# Patient Record
Sex: Female | Born: 1995 | Race: Black or African American | Hispanic: No | Marital: Single | Smoking: Never smoker
Health system: Southern US, Community
[De-identification: ages and names within clinical notes are randomized; demographics above are authoritative.]

## PROBLEM LIST (undated history)

## (undated) ENCOUNTER — Inpatient Hospital Stay (HOSPITAL_COMMUNITY): Payer: Self-pay

## (undated) DIAGNOSIS — Z789 Other specified health status: Secondary | ICD-10-CM

## (undated) HISTORY — PX: NO PAST SURGERIES: SHX2092

## (undated) HISTORY — PX: WISDOM TOOTH EXTRACTION: SHX21

---
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
## 2017-02-02 ENCOUNTER — Inpatient Hospital Stay (HOSPITAL_COMMUNITY)
Admission: AD | Admit: 2017-02-02 | Discharge: 2017-02-02 | Disposition: A | Discharge status: Home / Self Care | Payer: Medicaid Other | Source: Ambulatory Visit | Attending: Obstetrics & Gynecology | Admitting: Obstetrics & Gynecology

## 2017-02-02 ENCOUNTER — Encounter (HOSPITAL_COMMUNITY): Payer: Self-pay | Admitting: *Deleted

## 2017-02-02 DIAGNOSIS — R109 Unspecified abdominal pain: Secondary | ICD-10-CM

## 2017-02-02 DIAGNOSIS — Z3A21 21 weeks gestation of pregnancy: Secondary | ICD-10-CM | POA: Diagnosis not present

## 2017-02-02 DIAGNOSIS — N949 Unspecified condition associated with female genital organs and menstrual cycle: Secondary | ICD-10-CM

## 2017-02-02 DIAGNOSIS — O9989 Other specified diseases and conditions complicating pregnancy, childbirth and the puerperium: Secondary | ICD-10-CM | POA: Diagnosis not present

## 2017-02-02 DIAGNOSIS — O26892 Other specified pregnancy related conditions, second trimester: Secondary | ICD-10-CM | POA: Diagnosis not present

## 2017-02-02 DIAGNOSIS — O0932 Supervision of pregnancy with insufficient antenatal care, second trimester: Secondary | ICD-10-CM

## 2017-02-02 DIAGNOSIS — O26899 Other specified pregnancy related conditions, unspecified trimester: Secondary | ICD-10-CM

## 2017-02-02 DIAGNOSIS — R102 Pelvic and perineal pain: Secondary | ICD-10-CM | POA: Diagnosis not present

## 2017-02-02 HISTORY — DX: Other specified health status: Z78.9

## 2017-02-02 LAB — CBC WITH DIFFERENTIAL/PLATELET
BASOS ABS: 0 10*3/uL (ref 0.0–0.1)
Basophils Relative: 0 %
EOS ABS: 0 10*3/uL (ref 0.0–0.7)
EOS PCT: 1 %
HCT: 29.2 % — ABNORMAL LOW (ref 36.0–46.0)
HEMOGLOBIN: 10.2 g/dL — AB (ref 12.0–15.0)
LYMPHS PCT: 29 %
Lymphs Abs: 2.2 10*3/uL (ref 0.7–4.0)
MCH: 33.2 pg (ref 26.0–34.0)
MCHC: 34.9 g/dL (ref 30.0–36.0)
MCV: 95.1 fL (ref 78.0–100.0)
Monocytes Absolute: 0.3 10*3/uL (ref 0.1–1.0)
Monocytes Relative: 4 %
NEUTROS PCT: 66 %
Neutro Abs: 5.1 10*3/uL (ref 1.7–7.7)
PLATELETS: 154 10*3/uL (ref 150–400)
RBC: 3.07 MIL/uL — AB (ref 3.87–5.11)
RDW: 13.6 % (ref 11.5–15.5)
WBC: 7.7 10*3/uL (ref 4.0–10.5)

## 2017-02-02 LAB — ABO/RH: ABO/RH(D): A POS

## 2017-02-02 LAB — URINALYSIS, ROUTINE W REFLEX MICROSCOPIC
BILIRUBIN URINE: NEGATIVE
Glucose, UA: NEGATIVE mg/dL
Hgb urine dipstick: NEGATIVE
Ketones, ur: NEGATIVE mg/dL
LEUKOCYTES UA: NEGATIVE
NITRITE: NEGATIVE
Protein, ur: NEGATIVE mg/dL
SPECIFIC GRAVITY, URINE: 1.021 (ref 1.005–1.030)
pH: 6 (ref 5.0–8.0)

## 2017-02-02 LAB — WET PREP, GENITAL
CLUE CELLS WET PREP: NONE SEEN
Sperm: NONE SEEN
TRICH WET PREP: NONE SEEN
Yeast Wet Prep HPF POC: NONE SEEN

## 2017-02-02 LAB — POCT PREGNANCY, URINE: PREG TEST UR: POSITIVE — AB

## 2017-02-02 MED ORDER — PRENATAL COMPLETE 14-0.4 MG PO TABS: 1.0000 | ORAL_TABLET | Freq: Every day | ORAL | 6 refills | Status: DC

## 2017-02-02 NOTE — MAU Note (Signed)
+  hpt 2 wks ago.  Is experiencing a lot of pain, started last wk.cramping in lower abd.

## 2017-02-02 NOTE — MAU Provider Note (Signed)
History     CSN: [id number]  Arrival date and time: 02/02/17 1315   First Provider Initiated Contact with Patient 02/02/17 1352      Chief Complaint  Patient presents with  . Abdominal Pain   HPI [name] is a 21 y.o. G1P0 at [redacted]w[redacted]d by unsure LMP who presents to MAU today with complaint of intermittent abdominal pain. The patient states pain is worse with movement and changes in position. She states unsure LMP, but end of May, possibly. She states pain has been present x 1 week. She denies bleeding, discharge, N/V/D or constipation, fever, UTI symptoms today. She states no pain right now, but was 5/10 earlier today. She has not taken anything for pain.    OB History    Gravida Para Term Preterm AB Living   1             SAB TAB Ectopic Multiple Live Births                  Past Medical History:  Diagnosis Date  . Medical history non-contributory     Past Surgical History:  Procedure Laterality Date  . NO PAST SURGERIES    . WISDOM TOOTH EXTRACTION      Family History  Problem Relation Age of Onset  . Cancer Mother        breast  . Asthma Sister   . Heart disease Maternal Grandmother   . Diabetes Maternal Grandmother     Social History  Substance Use Topics  . Smoking status: Never Smoker  . Smokeless tobacco: Never Used  . Alcohol use No    Allergies: No Known Allergies  Prescriptions Prior to Admission  Medication Sig Dispense Refill Last Dose  . ibuprofen (ADVIL,MOTRIN) 600 MG tablet Take 1 tab PO Q6h x 2 days then Q6h prn 30 tablet 0     Review of Systems  Constitutional: Negative for fever.  Gastrointestinal: Positive for abdominal pain. Negative for constipation, diarrhea, nausea and vomiting.  Genitourinary: Negative for dysuria, frequency, urgency, vaginal bleeding and vaginal discharge.   Physical Exam   Blood pressure (!) 100/55, pulse 62, temperature 98.5 F (36.9 C), temperature source Oral, resp. rate 16, weight 162 lb 12 oz  (73.8 kg), last menstrual period 12/17/2016, SpO2 100 %.  Physical Exam  Nursing note and vitals reviewed. Constitutional: She is oriented to person, place, and time. She appears well-developed and well-nourished. No distress.  HENT:  Head: Normocephalic and atraumatic.  Cardiovascular: Normal rate.   Respiratory: Effort normal.  GI: Soft. She exhibits no distension and no mass. There is no tenderness. There is no rebound and no guarding.  Genitourinary: Uterus is enlarged (just above the umbilicus). No bleeding in the vagina. No vaginal discharge found.  Neurological: She is alert and oriented to person, place, and time.  Skin: Skin is warm and dry. No erythema.  Psychiatric: She has a normal mood and affect.  Dilation: Closed Effacement (%): Thick Cervical Position: Posterior Exam by:: Vonzella Nipple, PA-C   Results for orders placed or performed during the hospital encounter of 02/02/17 (from the past 24 hour(s))  Urinalysis, Routine w reflex microscopic     Status: Abnormal   Collection Time: 02/02/17  1:31 PM  Result Value Ref Range   Color, Urine YELLOW YELLOW   APPearance HAZY (A) CLEAR   Specific Gravity, Urine 1.021 1.005 - 1.030   pH 6.0 5.0 - 8.0   Glucose, UA NEGATIVE NEGATIVE mg/dL  Hgb urine dipstick NEGATIVE NEGATIVE   Bilirubin Urine NEGATIVE NEGATIVE   Ketones, ur NEGATIVE NEGATIVE mg/dL   Protein, ur NEGATIVE NEGATIVE mg/dL   Nitrite NEGATIVE NEGATIVE   Leukocytes, UA NEGATIVE NEGATIVE  Pregnancy, urine POC     Status: Abnormal   Collection Time: 02/02/17  1:42 PM  Result Value Ref Range   Preg Test, Ur POSITIVE (A) NEGATIVE  Wet prep, genital     Status: Abnormal   Collection Time: 02/02/17  1:52 PM  Result Value Ref Range   Yeast Wet Prep HPF POC NONE SEEN NONE SEEN   Trich, Wet Prep NONE SEEN NONE SEEN   Clue Cells Wet Prep HPF POC NONE SEEN NONE SEEN   WBC, Wet Prep HPF POC FEW (A) NONE SEEN   Sperm NONE SEEN   CBC with Differential/Platelet      Status: Abnormal (Preliminary result)   Collection Time: 02/02/17  1:59 PM  Result Value Ref Range   WBC 7.7 4.0 - 10.5 K/uL   RBC 3.07 (L) 3.87 - 5.11 MIL/uL   Hemoglobin 10.2 (L) 12.0 - 15.0 g/dL   HCT 78.229.2 (L) 95.636.0 - 21.346.0 %   MCV 95.1 78.0 - 100.0 fL   MCH 33.2 26.0 - 34.0 pg   MCHC 34.9 30.0 - 36.0 g/dL   RDW 08.613.6 57.811.5 - 46.915.5 %   Platelets 154 150 - 400 K/uL   Neutrophils Relative % 66 %   Neutro Abs 5.1 1.7 - 7.7 K/uL   Lymphocytes Relative 29 %   Lymphs Abs 2.2 0.7 - 4.0 K/uL   Monocytes Relative 4 %   Monocytes Absolute 0.3 0.1 - 1.0 K/uL   Eosinophils Relative 1 %   Eosinophils Absolute 0.0 0.0 - 0.7 K/uL   Basophils Relative 0 %   Basophils Absolute 0.0 0.0 - 0.1 K/uL   Other PENDING %  ABO/Rh     Status: None (Preliminary result)   Collection Time: 02/02/17  1:59 PM  Result Value Ref Range   ABO/RH(D) A POS    MAU Course  Procedures None  MDM +UPT FHR - 154 bpm with doppler UA, wet prep, GC/chlamydia, CBC, ABO/Rh, HIV, RPR today  Assessment and Plan  A: SIUP at 9734w0d by fundal height today +FHR  Round ligament pain  No prenatal care  P: Discharge home Tylenol PRN for pain  Discussed use of abdominal binder and warm bath/shower and moderation of activity for pain.  Work restriction note given  Second trimester precautions discussed Outpatient US ordered to confirm dating and complete anatomy. They will call her with an appointment.  Patient advised to follow-up with [facility] to start prenatal care. They will call her with an appointment.  Patient may return to MAU as needed or if her condition were to change or worsen   Vonzella NippleJulie Tu Shimmel, PA-C 02/02/2017, 2:42 PM

## 2017-02-02 NOTE — Discharge Instructions (Signed)
How a Baby Grows During Pregnancy °Pregnancy begins when a female's sperm enters a female's egg (fertilization). This happens in one of the tubes (fallopian tubes) that connect the ovaries to the womb (uterus). The fertilized egg is called an embryo until it reaches 10 weeks. From 10 weeks until birth, it is called a fetus. The fertilized egg moves down the fallopian tube to the uterus. Then it implants into the lining of the uterus and begins to grow. °The developing fetus receives oxygen and nutrients through the pregnant woman's bloodstream and the tissues that grow (placenta) to support the fetus. The placenta is the life support system for the fetus. It provides nutrition and removes waste. °Learning as much as you can about your pregnancy and how your baby is developing can help you enjoy the experience. It can also make you aware of when there might be a problem and when to ask questions. °How long does a typical pregnancy last? °A pregnancy usually lasts 280 days, or about 40 weeks. Pregnancy is divided into three trimesters: °· First trimester: 0-13 weeks. °· Second trimester: 14-27 weeks. °· Third trimester: 28-40 weeks. ° °The day when your baby is considered ready to be born (full term) is your estimated date of delivery. °How does my baby develop month by month? °First month °· The fertilized egg attaches to the inside of the uterus. °· Some cells will form the placenta. Others will form the fetus. °· The arms, legs, brain, spinal cord, lungs, and heart begin to develop. °· At the end of the first month, the heart begins to beat. ° °Second month °· The bones, inner ear, eyelids, hands, and feet form. °· The genitals develop. °· By the end of 8 weeks, all major organs are developing. ° °Third month °· All of the internal organs are forming. °· Teeth develop below the gums. °· Bones and muscles begin to grow. The spine can flex. °· The skin is transparent. °· Fingernails and toenails begin to form. °· Arms  and legs continue to grow longer, and hands and feet develop. °· The fetus is about 3 in (7.6 cm) long. ° °Fourth month °· The placenta is completely formed. °· The external sex organs, neck, outer ear, eyebrows, eyelids, and fingernails are formed. °· The fetus can hear, swallow, and move its arms and legs. °· The kidneys begin to produce urine. °· The skin is covered with a white waxy coating (vernix) and very fine hair (lanugo). ° °Fifth month °· The fetus moves around more and can be felt for the first time (quickening). °· The fetus starts to sleep and wake up and may begin to suck its finger. °· The nails grow to the end of the fingers. °· The organ in the digestive system that makes bile (gallbladder) functions and helps to digest the nutrients. °· If your baby is a girl, eggs are present in her ovaries. If your baby is a boy, testicles start to move down into his scrotum. ° °Sixth month °· The lungs are formed, but the fetus is not yet able to breathe. °· The eyes open. The brain continues to develop. °· Your baby has fingerprints and toe prints. Your baby's hair grows thicker. °· At the end of the second trimester, the fetus is about 9 in (22.9 cm) long. ° °Seventh month °· The fetus kicks and stretches. °· The eyes are developed enough to sense changes in light. °· The hands can make a grasping motion. °· The   fetus responds to sound. ° °Eighth month °· All organs and body systems are fully developed and functioning. °· Bones harden and taste buds develop. The fetus may hiccup. °· Certain areas of the brain are still developing. The skull remains soft. ° °Ninth month °· The fetus gains about ½ lb (0.23 kg) each week. °· The lungs are fully developed. °· Patterns of sleep develop. °· The fetus's head typically moves into a head-down position (vertex) in the uterus to prepare for birth. If the buttocks move into a vertex position instead, the baby is breech. °· The fetus weighs 6-9 lbs (2.72-4.08 kg) and is  19-20 in (48.26-50.8 cm) long. ° °What can I do to have a healthy pregnancy and help my baby develop? °Eating and Drinking °· Eat a healthy diet. °? Talk with your health care provider to make sure that you are getting the nutrients that you and your baby need. °? Visit www.choosemyplate.gov to learn about creating a healthy diet. °· Gain a healthy amount of weight during pregnancy as advised by your health care provider. This is usually 25-35 pounds. You may need to: °? Gain more if you were underweight before getting pregnant or if you are pregnant with more than one baby. °? Gain less if you were overweight or obese when you got pregnant. ° °Medicines and Vitamins °· Take prenatal vitamins as directed by your health care provider. These include vitamins such as folic acid, iron, calcium, and vitamin D. They are important for healthy development. °· Take medicines only as directed by your health care provider. Read labels and ask a pharmacist or your health care provider whether over-the-counter medicines, supplements, and prescription drugs are safe to take during pregnancy. ° °Activities °· Be physically active as advised by your health care provider. Ask your health care provider to recommend activities that are safe for you to do, such as walking or swimming. °· Do not participate in strenuous or extreme sports. ° °Lifestyle °· Do not drink alcohol. °· Do not use any tobacco products, including cigarettes, chewing tobacco, or electronic cigarettes. If you need help quitting, ask your health care provider. °· Do not use illegal drugs. ° °Safety °· Avoid exposure to mercury, lead, or other heavy metals. Ask your health care provider about common sources of these heavy metals. °· Avoid listeria infection during pregnancy. Follow these precautions: °? Do not eat soft cheeses or deli meats. °? Do not eat hot dogs unless they have been warmed up to the point of steaming, such as in the microwave oven. °? Do not  drink unpasteurized milk. °· Avoid toxoplasmosis infection during pregnancy. Follow these precautions: °? Do not change your cat's litter box, if you have a cat. Ask someone else to do this for you. °? Wear gardening gloves while working in the yard. ° °General Instructions °· Keep all follow-up visits as directed by your health care provider. This is important. This includes prenatal care and screening tests. °· Manage any chronic health conditions. Work closely with your health care provider to keep conditions, such as diabetes, under control. ° °How do I know if my baby is developing well? °At each prenatal visit, your health care provider will do several different tests to check on your health and keep track of your baby’s development. These include: °· Fundal height. °? Your health care provider will measure your growing belly from top to bottom using a tape measure. °? Your health care provider will also feel your belly   to determine your baby's position.  Heartbeat. ? An ultrasound in the first trimester can confirm pregnancy and show a heartbeat, depending on how far along you are. ? Your health care provider will check your baby's heart rate at every prenatal visit. ? As you get closer to your delivery date, you may have regular fetal heart rate monitoring to make sure that your baby is not in distress.  Second trimester ultrasound. ? This ultrasound checks your baby's development. It also indicates your babys gender.  What should I do if I have concerns about my baby's development? Always talk with your health care provider about any concerns that you may have. This information is not intended to replace advice given to you by your health care provider. Make sure you discuss any questions you have with your health care provider. Document Released: 12/25/2007 Document Revised: 12/14/2015 Document Reviewed: 12/15/2013 Elsevier Interactive Patient Education  2018 [employer] Second Trimester  of Pregnancy The second trimester is from week 13 through week 28, month 4 through 6. This is often the time in pregnancy that you feel your best. Often times, morning sickness has lessened or quit. You may have more energy, and you may get hungry more often. Your unborn baby (fetus) is growing rapidly. At the end of the sixth month, he or she is about 9 inches long and weighs about 1 pounds. You will likely feel the baby move (quickening) between 18 and 20 weeks of pregnancy. Follow these instructions at home:  Avoid all smoking, herbs, and alcohol. Avoid drugs not approved by your doctor.  Do not use any tobacco products, including cigarettes, chewing tobacco, and electronic cigarettes. If you need help quitting, ask your doctor. You may get counseling or other support to help you quit.  Only take medicine as told by your doctor. Some medicines are safe and some are not during pregnancy.  Exercise only as told by your doctor. Stop exercising if you start having cramps.  Eat regular, healthy meals.  Wear a good support bra if your breasts are tender.  Do not use hot tubs, steam rooms, or saunas.  Wear your seat belt when driving.  Avoid raw meat, uncooked cheese, and liter boxes and soil used by cats.  Take your prenatal vitamins.  Take 1500-2000 milligrams of calcium daily starting at the 20th week of pregnancy until you deliver your baby.  Try taking medicine that helps you poop (stool softener) as needed, and if your doctor approves. Eat more fiber by eating fresh fruit, vegetables, and whole grains. Drink enough fluids to keep your pee (urine) clear or pale yellow.  Take warm water baths (sitz baths) to soothe pain or discomfort caused by hemorrhoids. Use hemorrhoid cream if your doctor approves.  If you have puffy, bulging veins (varicose veins), wear support hose. Raise (elevate) your feet for 15 minutes, 3-4 times a day. Limit salt in your diet.  Avoid heavy lifting, wear low  heals, and sit up straight.  Rest with your legs raised if you have leg cramps or low back pain.  Visit your dentist if you have not gone during your pregnancy. Use a soft toothbrush to brush your teeth. Be gentle when you floss.  You can have sex (intercourse) unless your doctor tells you not to.  Go to your doctor visits. Get help if:  You feel dizzy.  You have mild cramps or pressure in your lower belly (abdomen).  You have a nagging pain in your belly area.  You continue to feel sick to your stomach (nauseous), throw up (vomit), or have watery poop (diarrhea).  You have bad smelling fluid coming from your vagina.  You have pain with peeing (urination). Get help right away if:  You have a fever.  You are leaking fluid from your vagina.  You have spotting or bleeding from your vagina.  You have severe belly cramping or pain.  You lose or gain weight rapidly.  You have trouble catching your breath and have chest pain.  You notice sudden or extreme puffiness (swelling) of your face, hands, ankles, feet, or legs.  You have not felt the baby move in over an hour.  You have severe headaches that do not go away with medicine.  You have vision changes. This information is not intended to replace advice given to you by your health care provider. Make sure you discuss any questions you have with your health care provider. Document Released: 10/02/2009 Document Revised: 12/14/2015 Document Reviewed: 09/08/2012 Elsevier Interactive Patient Education  2017 [employer] Round Ligament Pain The round ligament is a cord of muscle and tissue that helps to support the uterus. It can become a source of pain during pregnancy if it becomes stretched or twisted as the baby grows. The pain usually begins in the second trimester of pregnancy, and it can come and go until the baby is delivered. It is not a serious problem, and it does not cause harm to the baby. Round ligament pain is  usually a short, sharp, and pinching pain, but it can also be a dull, lingering, and aching pain. The pain is felt in the lower side of the abdomen or in the groin. It usually starts deep in the groin and moves up to the outside of the hip area. Pain can occur with:  A sudden change in position.  Rolling over in bed.  Coughing or sneezing.  Physical activity.  Follow these instructions at home: Watch your condition for any changes. Take these steps to help with your pain:  When the pain starts, relax. Then try: ? Sitting down. ? Flexing your knees up to your abdomen. ? Lying on your side with one pillow under your abdomen and another pillow between your legs. ? Sitting in a warm bath for 15-20 minutes or until the pain goes away.  Take over-the-counter and prescription medicines only as told by your health care provider.  Move slowly when you sit and stand.  Avoid long walks if they cause pain.  Stop or lessen your physical activities if they cause pain.  Contact a health care provider if:  Your pain does not go away with treatment.  You feel pain in your back that you did not have before.  Your medicine is not helping. Get help right away if:  You develop a fever or chills.  You develop uterine contractions.  You develop vaginal bleeding.  You develop nausea or vomiting.  You develop diarrhea.  You have pain when you urinate. This information is not intended to replace advice given to you by your health care provider. Make sure you discuss any questions you have with your health care provider. Document Released: 04/16/2008 Document Revised: 12/14/2015 Document Reviewed: 09/14/2014 Elsevier Interactive Patient Education  [employer].

## 2017-02-03 LAB — GC/CHLAMYDIA PROBE AMP (~~LOC~~) NOT AT ARMC
CHLAMYDIA, DNA PROBE: NEGATIVE
Neisseria Gonorrhea: NEGATIVE

## 2017-02-04 LAB — HIV ANTIBODY (ROUTINE TESTING W REFLEX): HIV Screen 4th Generation wRfx: NONREACTIVE

## 2017-02-04 LAB — RPR: RPR Ser Ql: NONREACTIVE

## 2017-02-10 ENCOUNTER — Ambulatory Visit (HOSPITAL_COMMUNITY)
Admission: RE | Admit: 2017-02-10 | Discharge: 2017-02-10 | Disposition: A | Discharge status: Home / Self Care | Payer: Medicaid Other | Source: Ambulatory Visit | Attending: Medical | Admitting: Medical

## 2017-02-10 ENCOUNTER — Other Ambulatory Visit (HOSPITAL_COMMUNITY): Payer: Self-pay | Admitting: Medical

## 2017-02-10 DIAGNOSIS — O0932 Supervision of pregnancy with insufficient antenatal care, second trimester: Secondary | ICD-10-CM | POA: Diagnosis present

## 2017-02-10 DIAGNOSIS — Z363 Encounter for antenatal screening for malformations: Secondary | ICD-10-CM | POA: Diagnosis not present

## 2017-02-10 DIAGNOSIS — Z3A22 22 weeks gestation of pregnancy: Secondary | ICD-10-CM | POA: Diagnosis not present

## 2017-02-11 ENCOUNTER — Encounter: Payer: Self-pay | Admitting: Medical

## 2017-02-11 DIAGNOSIS — O0932 Supervision of pregnancy with insufficient antenatal care, second trimester: Secondary | ICD-10-CM | POA: Insufficient documentation

## 2017-02-11 DIAGNOSIS — Z3400 Encounter for supervision of normal first pregnancy, unspecified trimester: Secondary | ICD-10-CM | POA: Insufficient documentation

## 2017-02-18 ENCOUNTER — Ambulatory Visit (INDEPENDENT_AMBULATORY_CARE_PROVIDER_SITE_OTHER): Payer: Medicaid Other | Admitting: Certified Nurse Midwife

## 2017-02-18 ENCOUNTER — Encounter: Payer: Self-pay | Admitting: Certified Nurse Midwife

## 2017-02-18 DIAGNOSIS — Z3400 Encounter for supervision of normal first pregnancy, unspecified trimester: Secondary | ICD-10-CM

## 2017-02-18 DIAGNOSIS — Z3402 Encounter for supervision of normal first pregnancy, second trimester: Secondary | ICD-10-CM | POA: Diagnosis not present

## 2017-02-18 LAB — POCT URINALYSIS DIP (DEVICE)
Bilirubin Urine: NEGATIVE
Glucose, UA: NEGATIVE mg/dL
HGB URINE DIPSTICK: NEGATIVE
Ketones, ur: NEGATIVE mg/dL
LEUKOCYTES UA: NEGATIVE
NITRITE: NEGATIVE
PH: 6 (ref 5.0–8.0)
Protein, ur: NEGATIVE mg/dL
Specific Gravity, Urine: 1.025 (ref 1.005–1.030)
UROBILINOGEN UA: 0.2 mg/dL (ref 0.0–1.0)

## 2017-02-18 NOTE — Progress Notes (Signed)
Subjective:  Amy Crane is a 21 y.o. G1P0 at 6527w5d being seen today for initial prenatal care.  She is currently monitored for the following issues for this low-risk pregnancy and has Supervision of normal first pregnancy, antepartum; Late prenatal care affecting pregnancy, antepartum, second trimester; and Supervision of normal first teen pregnancy, unspecified trimester on her problem list.  Patient reports no complaints.  Contractions: Not present. Vag. Bleeding: None.  Movement: Present. Denies leaking of fluid.   The following portions of the patient's history were reviewed and updated as appropriate: allergies, current medications, past family history, past medical history, past social history, past surgical history and problem list. Problem list updated.  Objective:   Vitals:   02/18/17 0948 02/18/17 0949  BP: (!) 100/56   Pulse: 67   Weight: 161 lb (73 kg)   Height:  5\' 2"  (1.575 m)    Fetal Status: Fetal Heart Rate (bpm): 153 Fundal Height: 24 cm Movement: Present     General:  Alert, oriented and cooperative. Patient is in no acute distress.  Skin: Skin is warm and dry. No rash noted.   Cardiovascular: Normal heart rate noted  Respiratory: Normal respiratory effort, no problems with respiration noted  Abdomen: Soft, gravid, appropriate for gestational age. Pain/Pressure: Present     Pelvic: Vag. Bleeding: None     Cervical exam deferred        Extremities: Normal range of motion.  Edema: None  Mental Status: Normal mood and affect. Normal behavior. Normal judgment and thought content.   Urinalysis:      Assessment and Plan:  Pregnancy: G1P0 at 9027w5d  1. Supervision of normal first pregnancy, antepartum - Culture, OB Urine - Hemoglobinopathy evaluation - Obstetric Panel, Including HIV - Rubella screen - Hepatitis B Surface antigen  Preterm labor symptoms and general obstetric precautions including but not limited to vaginal bleeding, contractions, leaking of  fluid and fetal movement were reviewed in detail with the patient. Please refer to After Visit Summary for other counseling recommendations.  Return in about 4 weeks (around 03/18/2017).   Donette LarryBhambri, Kenaz Olafson, CNM

## 2017-02-18 NOTE — Addendum Note (Signed)
Addended by: Lorelle GibbsWILSON, Aeneas Longsworth L on: 02/18/2017 11:05 AM   Modules accepted: Orders

## 2017-02-19 LAB — OBSTETRIC PANEL, INCLUDING HIV
Antibody Screen: NEGATIVE
BASOS ABS: 0 10*3/uL (ref 0.0–0.2)
Basos: 0 %
EOS (ABSOLUTE): 0 10*3/uL (ref 0.0–0.4)
EOS: 1 %
HEMATOCRIT: 31.1 % — AB (ref 34.0–46.6)
HEMOGLOBIN: 10.5 g/dL — AB (ref 11.1–15.9)
HEP B S AG: NEGATIVE
HIV SCREEN 4TH GENERATION: NONREACTIVE
Immature Grans (Abs): 0 10*3/uL (ref 0.0–0.1)
Immature Granulocytes: 1 %
LYMPHS ABS: 1.9 10*3/uL (ref 0.7–3.1)
Lymphs: 24 %
MCH: 34 pg — AB (ref 26.6–33.0)
MCHC: 33.8 g/dL (ref 31.5–35.7)
MCV: 101 fL — AB (ref 79–97)
MONOCYTES: 9 %
Monocytes Absolute: 0.7 10*3/uL (ref 0.1–0.9)
NEUTROS ABS: 5.1 10*3/uL (ref 1.4–7.0)
Neutrophils: 65 %
Platelets: 176 10*3/uL (ref 150–379)
RBC: 3.09 x10E6/uL — ABNORMAL LOW (ref 3.77–5.28)
RDW: 14.4 % (ref 12.3–15.4)
RH TYPE: POSITIVE
RPR: NONREACTIVE
RUBELLA: 13 {index} (ref >0.99)
WBC: 7.7 10*3/uL (ref 3.4–10.8)

## 2017-02-19 LAB — HEMOGLOBINOPATHY EVALUATION
HEMOGLOBIN F QUANTITATION: 2.7 % — AB (ref 0.0–2.0)
HGB C: 0 %
HGB S: 0 %
HGB VARIANT: 0 %
Hemoglobin A2 Quantitation: 2.3 % (ref 1.8–3.2)
Hgb A: 95 % — ABNORMAL LOW (ref 96.4–98.8)

## 2017-02-19 LAB — HEMOGLOBIN A1C
ESTIMATED AVERAGE GLUCOSE: 91 mg/dL
Hgb A1c MFr Bld: 4.8 % (ref 4.8–5.6)

## 2017-02-19 LAB — HEPATITIS B CORE ANTIBODY, IGM: Hep B C IgM: NEGATIVE

## 2017-02-19 LAB — RUBELLA ANTIBODY, IGM

## 2017-02-21 LAB — CULTURE, OB URINE

## 2017-02-21 LAB — URINE CULTURE, OB REFLEX

## 2017-03-18 ENCOUNTER — Ambulatory Visit (INDEPENDENT_AMBULATORY_CARE_PROVIDER_SITE_OTHER): Payer: Medicaid Other | Admitting: Certified Nurse Midwife

## 2017-03-18 VITALS — BP 104/64 | HR 59 | Wt 166.3 lb

## 2017-03-18 DIAGNOSIS — Z3402 Encounter for supervision of normal first pregnancy, second trimester: Secondary | ICD-10-CM

## 2017-03-18 DIAGNOSIS — Z23 Encounter for immunization: Secondary | ICD-10-CM

## 2017-03-18 DIAGNOSIS — Z3400 Encounter for supervision of normal first pregnancy, unspecified trimester: Secondary | ICD-10-CM

## 2017-03-18 NOTE — Patient Instructions (Signed)
AREA PEDIATRIC/FAMILY PRACTICE PHYSICIANS  [facility] [address] [place], [place] [address] Phone - [phone]   Fax - [phone]  [facility] [address] [place], [place]  [address] Phone - [phone]   Fax - [phone]  [facility] [address] [place], [place]  [address] Phone - [phone]   Fax - [phone]  [facility] [address] [place], [place]  [address] Phone - [phone]   Fax - [phone]  [facility] [address] [place], [place]  [address] Phone - [phone]   Fax - [phone]  [facility] [address] [place], [place]  [address] Phone - [phone]   Fax - [phone]  [facility] [address] [place], [place]  [address] Phone - [phone]   Fax - [phone]  [facility] [address] [place], [place]  [address] Phone - [phone]   Fax - [phone]  [facility] [address] [place], [place]  [address] Phone - [phone]   Fax - [phone] [facility] [address] [place], [place]  [address] Phone - [phone]   Fax - [phone]  [facility] [address] [place], [place]  [address] Phone - [phone]   Fax - [phone]  [facility] [address] [place], [place]  [address] Phone - [phone]   Fax - [phone]  [facility] [address] [place], [place]  [address] Phone - [phone]   Fax - [phone]  [facility] [address] [place], [place]  [address] Phone - [phone]  [facility] [address] [place], [place]  [address] Phone - [phone]   Fax - [phone]  [facility] [address] [place], [place]  [address] Phone - [phone]   Fax - [phone]  [facility] [address] [place], [place]  [address] Phone - [phone]   Fax - [phone]  [facility] [address] [place], [place]  [address] Phone - [phone]   Fax - [phone]   [facility] [address] [place],  [place]  [address] Phone - [phone]   Fax - [phone]  [facility] [address] [place], [place]  [address] Phone - [phone]   Fax - [phone]  [facility] [address] [place], [place]  [address] Phone - [phone]   Fax - [phone]  [facility] [address] [place], [place]  [address] Phone - [phone]   Fax - [phone]  [facility] [address] , [place]  [address] Phone - [phone]   Fax - [phone] [facility] [address] [place], [place]  [address] Phone - [phone]   Fax - [phone]  [facility] [address] [place], [place]  [address] Phone - [phone]   Fax - [phone]  [facility] [address] [place],   [address] Phone - [phone]   Fax - [phone]   

## 2017-03-18 NOTE — Progress Notes (Signed)
Subjective:  Amy Crane is a 21 y.o. G1P0 at [redacted]w[redacted]d being seen today for ongoing prenatal care.  She is currently monitored for the following issues for this low-risk pregnancy and has Supervision of normal first pregnancy, antepartum; Late prenatal care affecting pregnancy, antepartum, second trimester; and Supervision of normal first teen pregnancy, unspecified trimester on her problem list.  Patient reports no complaints.  Contractions: Not present. Vag. Bleeding: None.  Movement: Present. Denies leaking of fluid.   The following portions of the patient's history were reviewed and updated as appropriate: allergies, current medications, past family history, past medical history, past social history, past surgical history and problem list. Problem list updated.  Objective:   Vitals:   03/18/17 0840  BP: 104/64  Pulse: (!) 59  Weight: 166 lb 5 oz (75.4 kg)    Fetal Status: Fetal Heart Rate (bpm): 135 Fundal Height: 27 cm Movement: Present  Presentation: Undeterminable  General:  Alert, oriented and cooperative. Patient is in no acute distress.  Skin: Skin is warm and dry. No rash noted.   Cardiovascular: Normal heart rate noted  Respiratory: Normal respiratory effort, no problems with respiration noted  Abdomen: Soft, gravid, appropriate for gestational age. Pain/Pressure: Present     Pelvic: Vag. Bleeding: None     Cervical exam deferred        Extremities: Normal range of motion.  Edema: None  Mental Status: Normal mood and affect. Normal behavior. Normal judgment and thought content.   Urinalysis:      Assessment and Plan:  Pregnancy: G1P0 at [redacted]w[redacted]d  1. Supervision of normal first pregnancy, antepartum - Glucose Tolerance, 2 Hours w/1 Hour - CBC - RPR - HIV antibody (with reflex)  2. Need for Tdap vaccination - Tdap vaccine greater than or equal to 7yo IM  Preterm labor symptoms and general obstetric precautions including but not limited to vaginal bleeding,  contractions, leaking of fluid and fetal movement were reviewed in detail with the patient. Please refer to After Visit Summary for other counseling recommendations.  Return in about 2 weeks (around 04/01/2017).   Donette Larry, CNM

## 2017-03-19 LAB — RPR: RPR: NONREACTIVE

## 2017-03-19 LAB — CBC
HEMATOCRIT: 30.1 % — AB (ref 34.0–46.6)
HEMOGLOBIN: 10.4 g/dL — AB (ref 11.1–15.9)
MCH: 33.9 pg — ABNORMAL HIGH (ref 26.6–33.0)
MCHC: 34.6 g/dL (ref 31.5–35.7)
MCV: 98 fL — ABNORMAL HIGH (ref 79–97)
Platelets: 150 10*3/uL (ref 150–379)
RBC: 3.07 x10E6/uL — AB (ref 3.77–5.28)
RDW: 13.8 % (ref 12.3–15.4)
WBC: 7.7 10*3/uL (ref 3.4–10.8)

## 2017-03-19 LAB — HIV ANTIBODY (ROUTINE TESTING W REFLEX): HIV SCREEN 4TH GENERATION: NONREACTIVE

## 2017-03-19 LAB — GLUCOSE TOLERANCE, 2 HOURS W/ 1HR
GLUCOSE, 1 HOUR: 98 mg/dL (ref 65–179)
GLUCOSE, 2 HOUR: 75 mg/dL (ref 65–152)
Glucose, Fasting: 81 mg/dL (ref 65–91)

## 2017-04-03 ENCOUNTER — Ambulatory Visit (INDEPENDENT_AMBULATORY_CARE_PROVIDER_SITE_OTHER): Payer: Medicaid Other | Admitting: Advanced Practice Midwife

## 2017-04-03 VITALS — BP 104/54 | HR 51 | Wt 167.9 lb

## 2017-04-03 DIAGNOSIS — Z23 Encounter for immunization: Secondary | ICD-10-CM | POA: Diagnosis present

## 2017-04-03 DIAGNOSIS — Z3403 Encounter for supervision of normal first pregnancy, third trimester: Secondary | ICD-10-CM

## 2017-04-03 DIAGNOSIS — Z3400 Encounter for supervision of normal first pregnancy, unspecified trimester: Secondary | ICD-10-CM

## 2017-04-03 NOTE — Progress Notes (Signed)
   PRENATAL VISIT NOTE  Subjective:  Amy Crane is a 21 y.o. G1P0 at 7627w0d being seen today for ongoing prenatal care.  She is currently monitored for the following issues for this low-risk pregnancy and has Supervision of normal first pregnancy, antepartum; Late prenatal care affecting pregnancy, antepartum, second trimester; and Supervision of normal first teen pregnancy, unspecified trimester on her problem list.  Patient reports no complaints.  Contractions: Not present. Vag. Bleeding: None.  Movement: Present. Denies leaking of fluid.   The following portions of the patient's history were reviewed and updated as appropriate: allergies, current medications, past family history, past medical history, past social history, past surgical history and problem list. Problem list updated.  Objective:   Vitals:   04/03/17 0914  BP: (!) 104/54  Pulse: (!) 51  Weight: 167 lb 14.4 oz (76.2 kg)    Fetal Status: Fetal Heart Rate (bpm): 128   Movement: Present     General:  Alert, oriented and cooperative. Patient is in no acute distress.  Skin: Skin is warm and dry. No rash noted.   Cardiovascular: Normal heart rate noted  Respiratory: Normal respiratory effort, no problems with respiration noted  Abdomen: Soft, gravid, appropriate for gestational age.  Pain/Pressure: Present     Pelvic: Cervical exam deferred        Extremities: Normal range of motion.  Edema: None  Mental Status:  Normal mood and affect. Normal behavior. Normal judgment and thought content.   Assessment and Plan:  Pregnancy: G1P0 at 4427w0d  1. Need for immunization against influenza - Flu Vaccine QUAD 6+ mos IM (Fluarix)  2. Supervision of normal first pregnancy, antepartum -Routine care   Preterm labor symptoms and general obstetric precautions including but not limited to vaginal bleeding, contractions, leaking of fluid and fetal movement were reviewed in detail with the patient. Please refer to After Visit  Summary for other counseling recommendations.  Return in about 2 weeks (around 04/17/2017).   Thressa ShellerHeather Hogan, CNM

## 2017-04-03 NOTE — Patient Instructions (Signed)
Places to have your son circumcised:    [facility] [phone] $480 by 4 wks  [facility] [phone] $244 by 4 wks  [facility] [phone] $175 by 2 wks  [facility] [phone] $250 by 7 days [facility] [phone] $150 by 4 wks  These prices sometimes change but are roughly what you can expect to pay. Please call and confirm pricing.   Circumcision is considered an elective/non-medically necessary procedure. There are many reasons parents decide to have their sons circumsized. During the first year of life circumcised males have a reduced risk of urinary tract infections but after this year the rates between circumcised males and uncircumcised males are the same.  It is safe to have your son circumcised outside of the hospital and the places above perform them regularly.   AREA PEDIATRIC/FAMILY PRACTICE PHYSICIANS  [facility] [address] [place], [place]  [address] Phone - [phone]   Fax - [phone]  [facility] [address] [place], [place] [address] Phone - [phone]   Fax - [phone]  [facility] [address] [place], [place]  [address] Phone - [phone]   Fax - [phone]  [facility] [address] [place], [place]  [address] Phone - [phone]   Fax - [phone]  [facility] [address] [place], [place]  [address] Phone - [phone]   Fax - [phone]  [facility] [address] [place], [place]  [address] Phone - [phone]   Fax - [phone]  [facility] [place] [address] [place], [place]  [address] Phone - [phone]   Fax - [phone]  [facility] [address] [place], [address] [place], [place]  [address] Phone - [phone]   Fax - [phone]  [facility] AT [facility] [address] [place], [place]  [address] Phone - [phone]   Fax - [phone] [facility] [place] [address] [place], [place]  [address] Phone - [phone]   Fax - [phone]  [facility] [address] [place], [place]  [address] Phone - [phone]   Fax - [phone]  [facility] [address], [address] [place], [place]  [address] Phone - [phone]   Fax - [phone]  [facility] [address] [place], [place]  [address] Phone - [phone]   Fax - [phone]  [facility] [address], [address] [place], [place]  [address] Phone - [phone]  [facility] [address] [place], [place]  [address] Phone - [phone]   Fax - [phone]  [facility] [address] [place], [place]  [address] Phone - [phone]   Fax - [phone]  [facility] [address] [place], [place]  [address] Phone - [phone]   Fax - [phone]  [facility] [address] [place], [place]  [address] Phone - [phone]   Fax - [phone]   [facility] [address] [place], [place]  [address] Phone - [phone]   Fax - [phone]  [facility] [address] [place], [place]  [address] Phone - [phone]   Fax - [phone]  DAVID RUBIN [address] [place], [place]  [address] Phone - [phone]   Fax - [phone]  [facility] [address] [place], [place]  [address] Phone - [phone]   Fax - [phone]  [facility] [address] [place], [place]  [address] Phone - [phone]   Fax - [phone] [facility] [place] [address] [place], [place]  [address] Phone - [phone]   Fax - [phone]  [facility] [place] [address] [place], [place]  [address] Phone - [phone]   Fax - [phone]  [facility] [place] [address] [place], [place]  [address] Phone - [phone]   Fax - [phone]  [facility] [place] Wyvonne Lenzharlene Flemming MD [address] [place] [address] Phone [phone]  Fax [phone]

## 2017-04-17 ENCOUNTER — Ambulatory Visit (INDEPENDENT_AMBULATORY_CARE_PROVIDER_SITE_OTHER): Payer: Medicaid Other | Admitting: Certified Nurse Midwife

## 2017-04-17 VITALS — BP 103/60 | HR 56 | Wt 165.4 lb

## 2017-04-17 DIAGNOSIS — Z3403 Encounter for supervision of normal first pregnancy, third trimester: Secondary | ICD-10-CM

## 2017-04-17 DIAGNOSIS — Z3400 Encounter for supervision of normal first pregnancy, unspecified trimester: Secondary | ICD-10-CM

## 2017-04-17 NOTE — Patient Instructions (Signed)

## 2017-04-17 NOTE — Progress Notes (Signed)
Subjective:  Amy Crane is a 21 y.o. G1P0 at [redacted]w[redacted]d being seen today for ongoing prenatal care.  She is currently monitored for the following issues for this low-risk pregnancy and has Supervision of normal first pregnancy, antepartum; Late prenatal care affecting pregnancy, antepartum, second trimester; and Supervision of normal first teen pregnancy, unspecified trimester on her problem list.  Patient reports no complaints.  Contractions: Not present. Vag. Bleeding: None.  Movement: Present. Denies leaking of fluid.   The following portions of the patient's history were reviewed and updated as appropriate: allergies, current medications, past family history, past medical history, past social history, past surgical history and problem list. Problem list updated.  Objective:   Vitals:   04/17/17 1529  BP: 103/60  Pulse: (!) 56  Weight: 165 lb 6.4 oz (75 kg)    Fetal Status: Fetal Heart Rate (bpm): 143 Fundal Height: 32 cm Movement: Present  Presentation: Vertex  General:  Alert, oriented and cooperative. Patient is in no acute distress.  Skin: Skin is warm and dry. No rash noted.   Cardiovascular: Normal heart rate noted  Respiratory: Normal respiratory effort, no problems with respiration noted  Abdomen: Soft, gravid, appropriate for gestational age. Pain/Pressure: Absent     Pelvic: Vag. Bleeding: None     Cervical exam deferred        Extremities: Normal range of motion.  Edema: None  Mental Status: Normal mood and affect. Normal behavior. Normal judgment and thought content.   Urinalysis:      Assessment and Plan:  Pregnancy: G1P0 at [redacted]w[redacted]d  1. Supervision of normal first pregnancy, antepartum  Preterm labor symptoms and general obstetric precautions including but not limited to vaginal bleeding, contractions, leaking of fluid and fetal movement were reviewed in detail with the patient. Please refer to After Visit Summary for other counseling recommendations.  Return in  about 2 weeks (around 05/01/2017).   Donette Larry, CNM

## 2017-05-02 ENCOUNTER — Encounter: Payer: Medicaid Other | Admitting: Obstetrics and Gynecology

## 2017-05-05 ENCOUNTER — Encounter: Payer: Medicaid Other | Admitting: Obstetrics & Gynecology

## 2017-05-19 ENCOUNTER — Encounter: Payer: Medicaid Other | Admitting: Obstetrics & Gynecology

## 2017-05-19 ENCOUNTER — Other Ambulatory Visit (HOSPITAL_COMMUNITY)
Admission: RE | Admit: 2017-05-19 | Discharge: 2017-05-19 | Disposition: A | Discharge status: Home / Self Care | Payer: Medicaid Other | Source: Ambulatory Visit | Attending: Advanced Practice Midwife | Admitting: Advanced Practice Midwife

## 2017-05-19 ENCOUNTER — Encounter: Payer: Self-pay | Admitting: Advanced Practice Midwife

## 2017-05-19 ENCOUNTER — Ambulatory Visit (INDEPENDENT_AMBULATORY_CARE_PROVIDER_SITE_OTHER): Payer: Medicaid Other | Admitting: Advanced Practice Midwife

## 2017-05-19 ENCOUNTER — Ambulatory Visit (HOSPITAL_COMMUNITY): Admission: RE | Admit: 2017-05-19 | Payer: Medicaid Other | Source: Ambulatory Visit

## 2017-05-19 VITALS — BP 122/53 | HR 55 | Wt 177.0 lb

## 2017-05-19 DIAGNOSIS — Z3400 Encounter for supervision of normal first pregnancy, unspecified trimester: Secondary | ICD-10-CM

## 2017-05-19 DIAGNOSIS — Z3403 Encounter for supervision of normal first pregnancy, third trimester: Secondary | ICD-10-CM | POA: Insufficient documentation

## 2017-05-19 DIAGNOSIS — O1203 Gestational edema, third trimester: Secondary | ICD-10-CM

## 2017-05-19 DIAGNOSIS — R600 Localized edema: Secondary | ICD-10-CM

## 2017-05-19 NOTE — Patient Instructions (Signed)
Third Trimester of Pregnancy The third trimester is from week 28 through week 40 (months 7 through 9). The third trimester is a time when the unborn baby (fetus) is growing rapidly. At the end of the ninth month, the fetus is about 20 inches in length and weighs 6-10 pounds. Body changes during your third trimester Your body will continue to go through many changes during pregnancy. The changes vary from woman to woman. During the third trimester:  Your weight will continue to increase. You can expect to gain 25-35 pounds (11-16 kg) by the end of the pregnancy.  You may begin to get stretch marks on your hips, abdomen, and breasts.  You may urinate more often because the fetus is moving lower into your pelvis and pressing on your bladder.  You may develop or continue to have heartburn. This is caused by increased hormones that slow down muscles in the digestive tract.  You may develop or continue to have constipation because increased hormones slow digestion and cause the muscles that push waste through your intestines to relax.  You may develop hemorrhoids. These are swollen veins (varicose veins) in the rectum that can itch or be painful.  You may develop swollen, bulging veins (varicose veins) in your legs.  You may have increased body aches in the pelvis, back, or thighs. This is due to weight gain and increased hormones that are relaxing your joints.  You may have changes in your hair. These can include thickening of your hair, rapid growth, and changes in texture. Some women also have hair loss during or after pregnancy, or hair that feels dry or thin. Your hair will most likely return to normal after your baby is born.  Your breasts will continue to grow and they will continue to become tender. A yellow fluid (colostrum) may leak from your breasts. This is the first milk you are producing for your baby.  Your belly button may stick out.  You may notice more swelling in your hands,  face, or ankles.  You may have increased tingling or numbness in your hands, arms, and legs. The skin on your belly may also feel numb.  You may feel short of breath because of your expanding uterus.  You may have more problems sleeping. This can be caused by the size of your belly, increased need to urinate, and an increase in your body's metabolism.  You may notice the fetus "dropping," or moving lower in your abdomen (lightening).  You may have increased vaginal discharge.  You may notice your joints feel loose and you may have pain around your pelvic bone.  What to expect at prenatal visits You will have prenatal exams every 2 weeks until week 36. Then you will have weekly prenatal exams. During a routine prenatal visit:  You will be weighed to make sure you and the baby are growing normally.  Your blood pressure will be taken.  Your abdomen will be measured to track your baby's growth.  The fetal heartbeat will be listened to.  Any test results from the previous visit will be discussed.  You may have a cervical check near your due date to see if your cervix has softened or thinned (effaced).  You will be tested for Group B streptococcus. This happens between 35 and 37 weeks.  Your health care provider may ask you:  What your birth plan is.  How you are feeling.  If you are feeling the baby move.  If you have had   any abnormal symptoms, such as leaking fluid, bleeding, severe headaches, or abdominal cramping.  If you are using any tobacco products, including cigarettes, chewing tobacco, and electronic cigarettes.  If you have any questions.  Other tests or screenings that may be performed during your third trimester include:  Blood tests that check for low iron levels (anemia).  Fetal testing to check the health, activity level, and growth of the fetus. Testing is done if you have certain medical conditions or if there are problems during the  pregnancy.  Nonstress test (NST). This test checks the health of your baby to make sure there are no signs of problems, such as the baby not getting enough oxygen. During this test, a belt is placed around your belly. The baby is made to move, and its heart rate is monitored during movement.  What is false labor? False labor is a condition in which you feel small, irregular tightenings of the muscles in the womb (contractions) that usually go away with rest, changing position, or drinking water. These are called Braxton Hicks contractions. Contractions may last for hours, days, or even weeks before true labor sets in. If contractions come at regular intervals, become more frequent, increase in intensity, or become painful, you should see your health care provider. What are the signs of labor?  Abdominal cramps.  Regular contractions that start at 10 minutes apart and become stronger and more frequent with time.  Contractions that start on the top of the uterus and spread down to the lower abdomen and back.  Increased pelvic pressure and dull back pain.  A watery or bloody mucus discharge that comes from the vagina.  Leaking of amniotic fluid. This is also known as your "water breaking." It could be a slow trickle or a gush. Let your health care provider know if it has a color or strange odor. If you have any of these signs, call your health care provider right away, even if it is before your due date. Follow these instructions at home: Medicines  Follow your health care provider's instructions regarding medicine use. Specific medicines may be either safe or unsafe to take during pregnancy.  Take a prenatal vitamin that contains at least 600 micrograms (mcg) of folic acid.  If you develop constipation, try taking a stool softener if your health care provider approves. Eating and drinking  Eat a balanced diet that includes fresh fruits and vegetables, whole grains, good sources of protein  such as meat, eggs, or tofu, and low-fat dairy. Your health care provider will help you determine the amount of weight gain that is right for you.  Avoid raw meat and uncooked cheese. These carry germs that can cause birth defects in the baby.  If you have low calcium intake from food, talk to your health care provider about whether you should take a daily calcium supplement.  Eat four or five small meals rather than three large meals a day.  Limit foods that are high in fat and processed sugars, such as fried and sweet foods.  To prevent constipation: ? Drink enough fluid to keep your urine clear or pale yellow. ? Eat foods that are high in fiber, such as fresh fruits and vegetables, whole grains, and beans. Activity  Exercise only as directed by your health care provider. Most women can continue their usual exercise routine during pregnancy. Try to exercise for 30 minutes at least 5 days a week. Stop exercising if you experience uterine contractions.  Avoid heavy   lifting.  Do not exercise in extreme heat or humidity, or at high altitudes.  Wear low-heel, comfortable shoes.  Practice good posture.  You may continue to have sex unless your health care provider tells you otherwise. Relieving pain and discomfort  Take frequent breaks and rest with your legs elevated if you have leg cramps or low back pain.  Take warm sitz baths to soothe any pain or discomfort caused by hemorrhoids. Use hemorrhoid cream if your health care provider approves.  Wear a good support bra to prevent discomfort from breast tenderness.  If you develop varicose veins: ? Wear support pantyhose or compression stockings as told by your healthcare provider. ? Elevate your feet for 15 minutes, 3-4 times a day. Prenatal care  Write down your questions. Take them to your prenatal visits.  Keep all your prenatal visits as told by your health care provider. This is important. Safety  Wear your seat belt at  all times when driving.  Make a list of emergency phone numbers, including numbers for family, friends, the hospital, and police and fire departments. General instructions  Avoid cat litter boxes and soil used by cats. These carry germs that can cause birth defects in the baby. If you have a cat, ask someone to clean the litter box for you.  Do not travel far distances unless it is absolutely necessary and only with the approval of your health care provider.  Do not use hot tubs, steam rooms, or saunas.  Do not drink alcohol.  Do not use any products that contain nicotine or tobacco, such as cigarettes and e-cigarettes. If you need help quitting, ask your health care provider.  Do not use any medicinal herbs or unprescribed drugs. These chemicals affect the formation and growth of the baby.  Do not douche or use tampons or scented sanitary pads.  Do not cross your legs for long periods of time.  To prepare for the arrival of your baby: ? Take prenatal classes to understand, practice, and ask questions about labor and delivery. ? Make a trial run to the hospital. ? Visit the hospital and tour the maternity area. ? Arrange for maternity or paternity leave through employers. ? Arrange for family and friends to take care of pets while you are in the hospital. ? Purchase a rear-facing car seat and make sure you know how to install it in your car. ? Pack your hospital bag. ? Prepare the baby's nursery. Make sure to remove all pillows and stuffed animals from the baby's crib to prevent suffocation.  Visit your dentist if you have not gone during your pregnancy. Use a soft toothbrush to brush your teeth and be gentle when you floss. Contact a health care provider if:  You are unsure if you are in labor or if your water has broken.  You become dizzy.  You have mild pelvic cramps, pelvic pressure, or nagging pain in your abdominal area.  You have lower back pain.  You have persistent  nausea, vomiting, or diarrhea.  You have an unusual or bad smelling vaginal discharge.  You have pain when you urinate. Get help right away if:  Your water breaks before 37 weeks.  You have regular contractions less than 5 minutes apart before 37 weeks.  You have a fever.  You are leaking fluid from your vagina.  You have spotting or bleeding from your vagina.  You have severe abdominal pain or cramping.  You have rapid weight loss or weight gain.    You have shortness of breath with chest pain.  You notice sudden or extreme swelling of your face, hands, ankles, feet, or legs.  Your baby makes fewer than 10 movements in 2 hours.  You have severe headaches that do not go away when you take medicine.  You have vision changes. Summary  The third trimester is from week 28 through week 40, months 7 through 9. The third trimester is a time when the unborn baby (fetus) is growing rapidly.  During the third trimester, your discomfort may increase as you and your baby continue to gain weight. You may have abdominal, leg, and back pain, sleeping problems, and an increased need to urinate.  During the third trimester your breasts will keep growing and they will continue to become tender. A yellow fluid (colostrum) may leak from your breasts. This is the first milk you are producing for your baby.  False labor is a condition in which you feel small, irregular tightenings of the muscles in the womb (contractions) that eventually go away. These are called Braxton Hicks contractions. Contractions may last for hours, days, or even weeks before true labor sets in.  Signs of labor can include: abdominal cramps; regular contractions that start at 10 minutes apart and become stronger and more frequent with time; watery or bloody mucus discharge that comes from the vagina; increased pelvic pressure and dull back pain; and leaking of amniotic fluid. This information is not intended to replace advice  given to you by your health care provider. Make sure you discuss any questions you have with your health care provider. Document Released: 07/02/2001 Document Revised: 12/14/2015 Document Reviewed: 09/08/2012 Elsevier Interactive Patient Education  2017 Elsevier Inc.  

## 2017-05-19 NOTE — Progress Notes (Signed)
   PRENATAL VISIT NOTE  Subjective:  Amy Crane is a 21 y.o. G1P0 at [redacted]w[redacted]d being seen today for ongoing prenatal care.  She is currently monitored for the following issues for this low-risk pregnancy and has Supervision of normal first pregnancy, antepartum; Late prenatal care affecting pregnancy, antepartum, second trimester; and Supervision of normal first 20 pregnancy, unspecified trimester on her problem list.  Patient reports edema R>L x 2-3 days. Patient also has a boil on her left shoulder blade x 2 weeks.  Contractions: Not present. Vag. Bleeding: None.  Movement: Present. Denies leaking of fluid.   The following portions of the patient's history were reviewed and updated as appropriate: allergies, current medications, past family history, past medical history, past social history, past surgical history and problem list. Problem list updated.  Objective:   Vitals:   05/19/17 1413  BP: (!) 122/53  Pulse: (!) 55  Weight: 177 lb (80.3 kg)    Fetal Status: Fetal Heart Rate (bpm): 138 Fundal Height: 35 cm Movement: Present     General:  Alert, oriented and cooperative. Patient is in no acute distress.  Skin: Skin is warm and dry. No rash noted. 2cmx2cm firm, non-fluctuant boil on left shoulder blade. No erythema around it.   Cardiovascular: Normal heart rate noted Right calf 41cm, Left calf 389 cm, neg homans  Respiratory: Normal respiratory effort, no problems with respiration noted  Abdomen: Soft, gravid, appropriate for gestational age.  Pain/Pressure: Absent     Pelvic: Cervical exam performed Dilation: Fingertip Effacement (%): Thick Station: -2  Extremities: Normal range of motion.  Edema: Mild pitting, slight indentation  Mental Status:  Normal mood and affect. Normal behavior. Normal judgment and thought content.   Assessment and Plan:  Pregnancy: G1P0 at [redacted]w[redacted]d  1. Encounter for supervision of normal first pregnancy in third trimester  - Strep Gp B NAA -  Cervicovaginal ancillary only - will get doppler of right leg to R/O DVT  2. Abscess on left shoulder blade - no cellulitis -Warm compresses -Consider I&D in the future, not ready today   Preterm labor symptoms and general obstetric precautions including but not limited to vaginal bleeding, contractions, leaking of fluid and fetal movement were reviewed in detail with the patient. Please refer to After Visit Summary for other counseling recommendations.  Return in about 1 week (around 05/26/2017).   Thressa ShellerHeather Hogan, CNM

## 2017-05-19 NOTE — Progress Notes (Signed)
Edema to legs R>L

## 2017-05-20 LAB — CERVICOVAGINAL ANCILLARY ONLY
Chlamydia: NEGATIVE
Neisseria Gonorrhea: NEGATIVE

## 2017-05-21 LAB — STREP GP B NAA: Strep Gp B NAA: POSITIVE — AB

## 2017-05-22 ENCOUNTER — Ambulatory Visit (HOSPITAL_COMMUNITY)
Admission: RE | Admit: 2017-05-22 | Discharge: 2017-05-22 | Disposition: A | Discharge status: Home / Self Care | Payer: Medicaid Other | Source: Ambulatory Visit | Attending: Advanced Practice Midwife | Admitting: Advanced Practice Midwife

## 2017-05-22 DIAGNOSIS — Z3A00 Weeks of gestation of pregnancy not specified: Secondary | ICD-10-CM | POA: Diagnosis not present

## 2017-05-22 DIAGNOSIS — O1203 Gestational edema, third trimester: Secondary | ICD-10-CM

## 2017-05-22 DIAGNOSIS — R600 Localized edema: Secondary | ICD-10-CM | POA: Diagnosis not present

## 2017-05-22 NOTE — Progress Notes (Signed)
Rt lower extremity venous completed results can be found in chart under results review.

## 2017-05-27 ENCOUNTER — Ambulatory Visit (INDEPENDENT_AMBULATORY_CARE_PROVIDER_SITE_OTHER): Payer: Medicaid Other | Admitting: Obstetrics and Gynecology

## 2017-05-27 DIAGNOSIS — Z3400 Encounter for supervision of normal first pregnancy, unspecified trimester: Secondary | ICD-10-CM

## 2017-05-27 NOTE — Patient Instructions (Signed)
[ ]   GBS

## 2017-05-27 NOTE — Progress Notes (Signed)
   PRENATAL VISIT NOTE  Subjective:  Amy Crane is a 21 y.o. G1P0 at 2010w5d being seen today for ongoing prenatal care.  She is currently monitored for the following issues for this low-risk pregnancy and has Supervision of normal first pregnancy, antepartum; Late prenatal care affecting pregnancy, antepartum, second trimester; and Supervision of normal first teen pregnancy, unspecified trimester on their problem list.  Patient reports no complaints.  Contractions: Not present. Vag. Bleeding: None.  Movement: Present. Denies leaking of fluid.   The following portions of the patient's history were reviewed and updated as appropriate: allergies, current medications, past family history, past medical history, past social history, past surgical history and problem list. Problem list updated.  Objective:   Vitals:   05/27/17 1314  BP: 116/74  Pulse: 68  Weight: 80.1 kg (176 lb 9.6 oz)    Fetal Status: Fetal Heart Rate (bpm): 137   Movement: Present     General:  Alert, oriented and cooperative. Patient is in no acute distress.  Skin: Skin is warm and dry. No rash noted.   Cardiovascular: Normal heart rate noted  Respiratory: Normal respiratory effort, no problems with respiration noted  Abdomen: Soft, gravid, appropriate for gestational age.  Pain/Pressure: Present     Pelvic: Cervical exam deferred        Extremities: Normal range of motion.  Edema: Trace  Mental Status:  Normal mood and affect. Normal behavior. Normal judgment and thought content.   Assessment and Plan:  Pregnancy: G1P0 at 2010w5d  1. Supervision of normal first pregnancy, antepartum  - Doing well - GBS discussed.  There are no diagnoses linked to this encounter. Term labor symptoms and general obstetric precautions including but not limited to vaginal bleeding, contractions, leaking of fluid and fetal movement were reviewed in detail with the patient. Please refer to After Visit Summary for other counseling  recommendations.  Return in about 1 week (around 06/03/2017).   Venia CarbonJennifer Timothey Dahlstrom, NP

## 2017-06-02 ENCOUNTER — Encounter (HOSPITAL_COMMUNITY): Payer: Self-pay | Admitting: Certified Registered Nurse Anesthetist

## 2017-06-02 ENCOUNTER — Inpatient Hospital Stay (HOSPITAL_COMMUNITY): Payer: Medicaid Other | Admitting: Anesthesiology

## 2017-06-02 ENCOUNTER — Inpatient Hospital Stay (HOSPITAL_COMMUNITY)
Admission: AD | Admit: 2017-06-02 | Discharge: 2017-06-04 | DRG: 807 | Disposition: A | Discharge status: Home / Self Care | Payer: Medicaid Other | Source: Ambulatory Visit | Attending: Family Medicine | Admitting: Family Medicine

## 2017-06-02 ENCOUNTER — Encounter (HOSPITAL_COMMUNITY): Payer: Self-pay

## 2017-06-02 DIAGNOSIS — Z3A38 38 weeks gestation of pregnancy: Secondary | ICD-10-CM | POA: Diagnosis not present

## 2017-06-02 DIAGNOSIS — Z3400 Encounter for supervision of normal first pregnancy, unspecified trimester: Secondary | ICD-10-CM

## 2017-06-02 DIAGNOSIS — O99824 Streptococcus B carrier state complicating childbirth: Secondary | ICD-10-CM | POA: Diagnosis present

## 2017-06-02 DIAGNOSIS — Z3483 Encounter for supervision of other normal pregnancy, third trimester: Secondary | ICD-10-CM | POA: Diagnosis present

## 2017-06-02 LAB — CBC
HCT: 32.1 % — ABNORMAL LOW (ref 36.0–46.0)
Hemoglobin: 10.7 g/dL — ABNORMAL LOW (ref 12.0–15.0)
MCH: 31.4 pg (ref 26.0–34.0)
MCHC: 33.3 g/dL (ref 30.0–36.0)
MCV: 94.1 fL (ref 78.0–100.0)
PLATELETS: 159 10*3/uL (ref 150–400)
RBC: 3.41 MIL/uL — ABNORMAL LOW (ref 3.87–5.11)
RDW: 12.5 % (ref 11.5–15.5)
WBC: 7.7 10*3/uL (ref 4.0–10.5)

## 2017-06-02 LAB — TYPE AND SCREEN
ABO/RH(D): A POS
ANTIBODY SCREEN: NEGATIVE

## 2017-06-02 LAB — RPR: RPR Ser Ql: NONREACTIVE

## 2017-06-02 MED ORDER — OXYCODONE-ACETAMINOPHEN 5-325 MG PO TABS: 1.0000 | ORAL_TABLET | ORAL | Status: DC | PRN

## 2017-06-02 MED ORDER — LACTATED RINGERS IV SOLN
INTRAVENOUS | Status: DC
  Administered 2017-06-02: 08:00:00 via INTRAVENOUS

## 2017-06-02 MED ORDER — EPHEDRINE 5 MG/ML INJ
10.0000 mg | INTRAVENOUS | Status: DC | PRN
  Filled 2017-06-02: qty 2

## 2017-06-02 MED ORDER — SIMETHICONE 80 MG PO CHEW
80.0000 mg | CHEWABLE_TABLET | ORAL | Status: DC | PRN
Start: 2017-06-02 — End: 2017-06-04

## 2017-06-02 MED ORDER — PENICILLIN G POT IN DEXTROSE 60000 UNIT/ML IV SOLN
3.0000 10*6.[IU] | INTRAVENOUS | Status: DC
  Administered 2017-06-02: 3 10*6.[IU] via INTRAVENOUS
  Filled 2017-06-02 (×4): qty 50

## 2017-06-02 MED ORDER — ONDANSETRON HCL 4 MG PO TABS: 4.0000 mg | ORAL_TABLET | ORAL | Status: DC | PRN

## 2017-06-02 MED ORDER — ONDANSETRON HCL 4 MG/2ML IJ SOLN: 4.0000 mg | Freq: Four times a day (QID) | INTRAMUSCULAR | Status: DC | PRN

## 2017-06-02 MED ORDER — ZOLPIDEM TARTRATE 5 MG PO TABS: 5.0000 mg | ORAL_TABLET | Freq: Every evening | ORAL | Status: DC | PRN

## 2017-06-02 MED ORDER — TETANUS-DIPHTH-ACELL PERTUSSIS 5-2.5-18.5 LF-MCG/0.5 IM SUSP: 0.5000 mL | Freq: Once | INTRAMUSCULAR | Status: DC

## 2017-06-02 MED ORDER — PHENYLEPHRINE 40 MCG/ML (10ML) SYRINGE FOR IV PUSH (FOR BLOOD PRESSURE SUPPORT)
80.0000 ug | PREFILLED_SYRINGE | INTRAVENOUS | Status: DC | PRN
  Filled 2017-06-02: qty 5

## 2017-06-02 MED ORDER — LACTATED RINGERS IV SOLN: 500.0000 mL | Freq: Once | INTRAVENOUS | Status: DC

## 2017-06-02 MED ORDER — DEXTROSE 5 % IV SOLN
5.0000 10*6.[IU] | Freq: Once | INTRAVENOUS | Status: AC
  Administered 2017-06-02: 5 10*6.[IU] via INTRAVENOUS
  Filled 2017-06-02: qty 5

## 2017-06-02 MED ORDER — COCONUT OIL OIL
1.0000 | TOPICAL_OIL | Status: DC | PRN
Start: 2017-06-02 — End: 2017-06-04

## 2017-06-02 MED ORDER — ACETAMINOPHEN 325 MG PO TABS
650.0000 mg | ORAL_TABLET | ORAL | Status: DC | PRN
Start: 2017-06-02 — End: 2017-06-04

## 2017-06-02 MED ORDER — OXYCODONE-ACETAMINOPHEN 5-325 MG PO TABS: 2.0000 | ORAL_TABLET | ORAL | Status: DC | PRN

## 2017-06-02 MED ORDER — PRENATAL MULTIVITAMIN CH
1.0000 | ORAL_TABLET | Freq: Every day | ORAL | Status: DC
  Administered 2017-06-03 – 2017-06-04 (×2): 1 via ORAL
  Filled 2017-06-02 (×2): qty 1

## 2017-06-02 MED ORDER — LACTATED RINGERS IV SOLN: 500.0000 mL | INTRAVENOUS | Status: DC | PRN

## 2017-06-02 MED ORDER — DIPHENHYDRAMINE HCL 25 MG PO CAPS: 25.0000 mg | ORAL_CAPSULE | Freq: Four times a day (QID) | ORAL | Status: DC | PRN

## 2017-06-02 MED ORDER — SENNOSIDES-DOCUSATE SODIUM 8.6-50 MG PO TABS
2.0000 | ORAL_TABLET | ORAL | Status: DC
  Administered 2017-06-02 – 2017-06-03 (×2): 2 via ORAL
  Filled 2017-06-02 (×2): qty 2

## 2017-06-02 MED ORDER — PHENYLEPHRINE 40 MCG/ML (10ML) SYRINGE FOR IV PUSH (FOR BLOOD PRESSURE SUPPORT)
80.0000 ug | PREFILLED_SYRINGE | INTRAVENOUS | Status: DC | PRN
  Filled 2017-06-02: qty 10
  Filled 2017-06-02: qty 5

## 2017-06-02 MED ORDER — FENTANYL CITRATE (PF) 100 MCG/2ML IJ SOLN: 50.0000 ug | INTRAMUSCULAR | Status: DC | PRN

## 2017-06-02 MED ORDER — WITCH HAZEL-GLYCERIN EX PADS: 1.0000 "application " | MEDICATED_PAD | CUTANEOUS | Status: DC | PRN

## 2017-06-02 MED ORDER — DIBUCAINE 1 % RE OINT: 1.0000 "application " | TOPICAL_OINTMENT | RECTAL | Status: DC | PRN

## 2017-06-02 MED ORDER — OXYTOCIN BOLUS FROM INFUSION
500.0000 mL | Freq: Once | INTRAVENOUS | Status: AC
  Administered 2017-06-02: 500 mL via INTRAVENOUS

## 2017-06-02 MED ORDER — OXYTOCIN 40 UNITS IN LACTATED RINGERS INFUSION - SIMPLE MED
2.5000 [IU]/h | INTRAVENOUS | Status: DC
  Administered 2017-06-02: 2.5 [IU]/h via INTRAVENOUS
  Filled 2017-06-02: qty 1000

## 2017-06-02 MED ORDER — DIPHENHYDRAMINE HCL 50 MG/ML IJ SOLN: 12.5000 mg | INTRAMUSCULAR | Status: DC | PRN

## 2017-06-02 MED ORDER — BENZOCAINE-MENTHOL 20-0.5 % EX AERO
1.0000 "application " | INHALATION_SPRAY | CUTANEOUS | Status: DC | PRN
  Administered 2017-06-03: 1 via TOPICAL
  Filled 2017-06-02: qty 56

## 2017-06-02 MED ORDER — FENTANYL 2.5 MCG/ML BUPIVACAINE 1/10 % EPIDURAL INFUSION (WH - ANES)
14.0000 mL/h | INTRAMUSCULAR | Status: DC | PRN
  Administered 2017-06-02: 12 mL/h via EPIDURAL
  Filled 2017-06-02: qty 100

## 2017-06-02 MED ORDER — IBUPROFEN 600 MG PO TABS
600.0000 mg | ORAL_TABLET | Freq: Four times a day (QID) | ORAL | Status: DC
  Administered 2017-06-02 – 2017-06-04 (×8): 600 mg via ORAL
  Filled 2017-06-02 (×8): qty 1

## 2017-06-02 MED ORDER — LIDOCAINE HCL (PF) 1 % IJ SOLN
30.0000 mL | INTRAMUSCULAR | Status: DC | PRN
  Filled 2017-06-02: qty 30

## 2017-06-02 MED ORDER — LIDOCAINE HCL (PF) 1 % IJ SOLN
INTRAMUSCULAR | Status: DC | PRN
  Administered 2017-06-02: 6 mL via EPIDURAL
  Administered 2017-06-02: 4 mL via EPIDURAL

## 2017-06-02 MED ORDER — ONDANSETRON HCL 4 MG/2ML IJ SOLN: 4.0000 mg | INTRAMUSCULAR | Status: DC | PRN

## 2017-06-02 MED ORDER — LACTATED RINGERS IV SOLN
INTRAVENOUS | Status: DC
  Administered 2017-06-02: 13:00:00 via INTRAVENOUS

## 2017-06-02 MED ORDER — ACETAMINOPHEN 325 MG PO TABS: 650.0000 mg | ORAL_TABLET | ORAL | Status: DC | PRN

## 2017-06-02 MED ORDER — SOD CITRATE-CITRIC ACID 500-334 MG/5ML PO SOLN: 30.0000 mL | ORAL | Status: DC | PRN

## 2017-06-02 NOTE — MAU Note (Signed)
Pt here with c/o contractions since about 11am yesterday, worsening about 11pm. Denies any bleeding or leaking. Reports good fetal movement. GBS positive.

## 2017-06-02 NOTE — Anesthesia Pain Management Evaluation Note (Signed)
  CRNA Pain Management Visit Note  Patient: Amy Crane, 21 y.o., female  "Hello I am a member of the anesthesia team at [facility]. We have an anesthesia team available at all times to provide care throughout the hospital, including epidural management and anesthesia for C-section. I don't know your plan for the delivery whether it a natural birth, water birth, IV sedation, nitrous supplementation, doula or epidural, but we want to meet your pain goals."   1.Was your pain managed to your expectations on prior hospitalizations?   No prior hospitalizations  2.What is your expectation for pain management during this hospitalization?     Epidural  3.How can we help you reach that goal? Epidural in place.  Record the patient's initial score and the patient's pain goal.   Pain: 0  Pain Goal: 8/10 prior to epidural placement.  [facility] wants you to be able to say your pain was always managed very well.  Amy Crane 06/02/2017

## 2017-06-02 NOTE — Anesthesia Postprocedure Evaluation (Signed)
Anesthesia Post Note  Patient: Amy Crane  Procedure(s) Performed: AN AD HOC LABOR EPIDURAL     Patient location during evaluation: Mother Baby Anesthesia Type: Epidural Level of consciousness: awake, awake and alert, oriented and patient cooperative Pain management: pain level controlled Vital Signs Assessment: post-procedure vital signs reviewed and stable Respiratory status: spontaneous breathing, nonlabored ventilation and respiratory function stable Cardiovascular status: stable Postop Assessment: no headache, no backache, patient able to bend at knees and no apparent nausea or vomiting Anesthetic complications: no    Last Vitals:  Vitals:   06/02/17 1609 06/02/17 1720  BP: 125/65 117/65  Pulse: 68 60  Resp: 16 16  Temp: 37 C 36.8 C  SpO2: 100% 100%    Last Pain:  Vitals:   06/02/17 1810  TempSrc:   PainSc: 0-No pain   Pain Goal: Patients Stated Pain Goal: 5 (06/02/17 0838)               Jezelle Gullick L

## 2017-06-02 NOTE — H&P (Signed)
Obstetric History and Physical  Sagan D Fleece is a 21 y.o. G1P0 with IUP at 5315w4d presenting for SOL. Contractions started intensifying today around 0300. Patient states she has been having  regular contractions, none vaginal bleeding, intact membranes, with active fetal movement.    Prenatal [facility] ; late to prenatal care with initiation at 2157w5d Dating: By 22wk US --->  Estimated Date of Delivery: 06/12/17 Pregnancy complications or risks: Patient Active Problem List   Diagnosis Date Noted  . Indication for care in labor or delivery 06/02/2017  . Supervision of normal first teen pregnancy, unspecified trimester 02/18/2017  . Supervision of normal first pregnancy, antepartum 02/11/2017  . Late prenatal care affecting pregnancy, antepartum, second trimester 02/11/2017   She plans to breastfeed, plans to bottle feed She is undecided for postpartum contraception.   Sono:    @[redacted]w[redacted]d , CWD, normal anatomy, breech presentation, posterior placenta, 555g, 57% EFW  Prenatal labs and studies: ABO, Rh: --/--/A POS (11/12 0800) Antibody: NEG (11/12 0800) Rubella: 13.00, <20.0 (07/31 1030) RPR: Non Reactive (08/28 0831)  HBsAg: Negative (07/31 1030)  HIV:   Non-Reactive WNU:UVOZDGUYGBS:Positive (10/29 1537) 2 hr Glucola  wnl Genetic screening too late Anatomy US normal  Prenatal Transfer Tool  Maternal Diabetes: No Genetic Screening: Declined Maternal Ultrasounds/Referrals: Normal Fetal Ultrasounds or other Referrals:  None Maternal Substance Abuse:  No Significant Maternal Medications:  None Significant Maternal Lab Results: Lab values include: Group B Strep positive  Past Medical History:  Diagnosis Date  . Medical history non-contributory     Past Surgical History:  Procedure Laterality Date  . NO PAST SURGERIES    . WISDOM TOOTH EXTRACTION      OB History  Gravida Para Term Preterm AB Living  1            SAB TAB Ectopic Multiple Live Births                # Outcome Date GA Lbr Len/2nd Weight Sex Delivery Anes PTL Lv  1 Current               Social History   Socioeconomic History  . Marital status: Single    Spouse name: None  . Number of children: None  . Years of education: None  . Highest education level: None  Social Needs  . Financial resource strain: None  . Food insecurity - worry: None  . Food insecurity - inability: None  . Transportation needs - medical: None  . Transportation needs - non-medical: None  Occupational History  . None  Tobacco Use  . Smoking status: Never Smoker  . Smokeless tobacco: Never Used  Substance and Sexual Activity  . Alcohol use: No  . Drug use: Yes    Frequency: 2.0 times per week    Types: Marijuana    Comment: stopped after +HPT  . Sexual activity: Yes  Other Topics Concern  . None  Social History Narrative  . None    Family History  Problem Relation Age of Onset  . Cancer Mother        breast  . Asthma Sister   . Heart disease Maternal Grandmother   . Diabetes Maternal Grandmother     Medications Prior to Admission  Medication Sig Dispense Refill Last Dose  . Prenatal Vit-Fe Fumarate-FA (PRENATAL COMPLETE) 14-0.4 MG TABS Take 1 tablet by mouth daily. 60 each 6 06/01/2017 at Unknown time    No Known Allergies  Review of Systems: Negative  except for what is mentioned in HPI.  Physical Exam: BP 96/79   Pulse 79   Temp 98.5 F (36.9 C) (Oral)   Resp 18   Ht 5\' 2"  (1.575 m)   Wt 83 kg (183 lb)   LMP 12/17/2016   SpO2 100%   BMI 33.47 kg/m  CONSTITUTIONAL: Well-developed, well-nourished female in no acute distress.  HENT:  Normocephalic, atraumatic, External right and left ear normal. Oropharynx is clear and moist EYES: Conjunctivae and EOM are normal. Pupils are equal, round, and reactive to light. No scleral icterus.  NECK: Normal range of motion, supple, no masses SKIN: Skin is warm and dry. No rash noted. Not diaphoretic. No erythema. No pallor. NEUROLOGIC:  Alert and oriented to person, place, and time. Normal reflexes, muscle tone coordination. No cranial nerve deficit noted. PSYCHIATRIC: Normal mood and affect. Normal behavior. Normal judgment and thought content. CARDIOVASCULAR: Normal heart rate noted, regular rhythm RESPIRATORY: Effort and breath sounds normal, no problems with respiration noted ABDOMEN: Soft, nontender, nondistended, gravid. MUSCULOSKELETAL: Normal range of motion. No edema and no tenderness. 2+ distal pulses.  Cervical Exam: Dilation: 5 Effacement (%): 90 Cervical Position: Middle Station: -2 Presentation: Vertex Exam by:: Arne ClevelandJennifer Hazelwood, RN/Christy Barbarann EhlersGoodnight, RN  Presentation: cephalic FHT:  Baseline rate 120 bpm   Variability moderate  Accelerations present   Decelerations none Contractions: Every 2-4 mins   Pertinent Labs/Studies:   Results for orders placed or performed during the hospital encounter of 06/02/17 (from the past 24 hour(s))  CBC     Status: Abnormal   Collection Time: 06/02/17  7:59 AM  Result Value Ref Range   WBC 7.7 4.0 - 10.5 K/uL   RBC 3.41 (L) 3.87 - 5.11 MIL/uL   Hemoglobin 10.7 (L) 12.0 - 15.0 g/dL   HCT 16.132.1 (L) 09.636.0 - 04.546.0 %   MCV 94.1 78.0 - 100.0 fL   MCH 31.4 26.0 - 34.0 pg   MCHC 33.3 30.0 - 36.0 g/dL   RDW 40.912.5 81.111.5 - 91.415.5 %   Platelets 159 150 - 400 K/uL  Type and screen [facility]     Status: None   Collection Time: 06/02/17  8:00 AM  Result Value Ref Range   ABO/RH(D) A POS    Antibody Screen NEG    Sample Expiration 06/05/2017     Assessment : Haileyann D Mccurdy is a 21 y.o. G1P0 at 6628w4d being admitted for labor.  Plan: Labor: Expectant management. Augmentation as needed.  Epidural for analgesia. FWB: Reassuring fetal heart tracing.  Cat 1 GBS positive - PCN Delivery plan: Hopeful for vaginal delivery   Caryl AdaJazma Katy Brickell, DO OB Fellow Faculty Practice, [facility] [place] 06/02/2017, 9:33 AM

## 2017-06-02 NOTE — Anesthesia Procedure Notes (Signed)
Epidural Patient location during procedure: OB Start time: 06/02/2017 9:00 AM End time: 06/02/2017 9:14 AM  Staffing Anesthesiologist: Jairo BenJackson, Taevion Sikora, MD Performed: anesthesiologist   Preanesthetic Checklist Completed: patient identified, surgical consent, pre-op evaluation, timeout performed, IV checked, risks and benefits discussed and monitors and equipment checked  Epidural Patient position: sitting Prep: site prepped and draped and DuraPrep Patient monitoring: blood pressure, continuous pulse ox and heart rate Approach: midline Location: L2-L3 Injection technique: LOR air  Needle:  Needle type: Tuohy  Needle gauge: 17 G Needle length: 9 cm Needle insertion depth: 4.5 cm Catheter type: closed end flexible Catheter size: 19 Gauge Catheter at skin depth: 9.5 cm Test dose: negative (1% lidocaine)  Assessment Events: blood not aspirated, injection not painful, no injection resistance, negative IV test and no paresthesia  Additional Notes Pt identified in Labor room.  Monitors applied. Working IV access confirmed. Sterile prep, drape lumbar spine.  1% lido local L 2,3.  #17ga Touhy LOR air at 4.5 cm L 2,3, cath in easily to 9.5 cm skin. Test dose OK, cath dosed and infusion begun.  Patient asymptomatic, VSS, no heme aspirated, tolerated well.  Sandford Craze Aretta Stetzel, MDReason for block:procedure for pain

## 2017-06-02 NOTE — Anesthesia Preprocedure Evaluation (Addendum)
Anesthesia Evaluation  Patient identified by MRN, date of birth, ID band Patient awake    Reviewed: Allergy & Precautions, NPO status , Patient's Chart, lab work & pertinent test results  History of Anesthesia Complications Negative for: history of anesthetic complications  Airway Mallampati: III  TM Distance: >3 FB Neck ROM: Full    Dental  (+) Dental Advisory Given   Pulmonary neg pulmonary ROS,    breath sounds clear to auscultation       Cardiovascular negative cardio ROS   Rhythm:Regular Rate:Normal     Neuro/Psych negative neurological ROS     GI/Hepatic negative GI ROS, Neg liver ROS,   Endo/Other  negative endocrine ROS  Renal/GU negative Renal ROS     Musculoskeletal   Abdominal   Peds  Hematology plt 159k   Anesthesia Other Findings   Reproductive/Obstetrics (+) Pregnancy                            Anesthesia Physical Anesthesia Plan  ASA: II  Anesthesia Plan: Epidural   Post-op Pain Management:    Induction:   PONV Risk Score and Plan: Treatment may vary due to age or medical condition  Airway Management Planned: Natural Airway  Additional Equipment:   Intra-op Plan:   Post-operative Plan:   Informed Consent: I have reviewed the patients History and Physical, chart, labs and discussed the procedure including the risks, benefits and alternatives for the proposed anesthesia with the patient or authorized representative who has indicated his/her understanding and acceptance.   Dental advisory given  Plan Discussed with:   Anesthesia Plan Comments: (Patient identified. Risks/Benefits/Options discussed with patient including but not limited to bleeding, infection, nerve damage, paralysis, failed block, incomplete pain control, headache, blood pressure changes, nausea, vomiting, reactions to medication both or allergic, itching and postpartum back pain. Confirmed  with bedside nurse the patient's most recent platelet count. Confirmed with patient that they are not currently taking any anticoagulation, have any bleeding history or any family history of bleeding disorders. Patient expressed understanding and wished to proceed. All questions were answered. )       Anesthesia Quick Evaluation

## 2017-06-03 ENCOUNTER — Other Ambulatory Visit: Payer: Self-pay

## 2017-06-03 ENCOUNTER — Encounter: Payer: Medicaid Other | Admitting: Obstetrics and Gynecology

## 2017-06-03 NOTE — Progress Notes (Signed)
POSTPARTUM PROGRESS NOTE  Post Partum Day 1  Subjective:  Amy Crane is a 21 y.o. G1P1001 s/p NSVD at 2952w4d.  No acute events overnight.  Pt denies problems with ambulating, voiding or po intake.  She denies nausea or vomiting.  Pain is well controlled.  She has had flatus. She has not had bowel movement.  Lochia Small.   Objective: Blood pressure (!) 116/54, pulse (!) 48, temperature 98.1 F (36.7 C), temperature source Oral, resp. rate 16, height 5\' 2"  (1.575 m), weight 183 lb (83 kg), last menstrual period 12/17/2016, SpO2 100 %, unknown if currently breastfeeding.  Physical Exam:  General: alert, cooperative and no distress Chest: no respiratory distress Heart:regular rate, distal pulses intact Abdomen: soft, nontender,  Uterine Fundus: firm, appropriately tender DVT Evaluation: No calf swelling or tenderness Extremities: no edema Skin: warm, dry  Recent Labs    06/02/17 0759  HGB 10.7*  HCT 32.1*    Assessment/Plan: Amy Crane is a 21 y.o. G1P1001 s/p NSVD at 952w4d   PPD#1 - Doing well Contraception: Undecided. She wanted the patch due to her desire to breastfeed, she is considering other options. Discussed Nexplanon with her [facility]([place] she wants to think about it and talk further tomorrow after reading literature. Feeding: breast Dispo: Plan for discharge tomorrow.   LOS: 1 day   Lynnae PrudeKeriann S MinottMD 06/03/2017, 11:16 AM

## 2017-06-03 NOTE — Lactation Note (Signed)
This note was copied from a baby's chart. Lactation Consultation Note  Patient Name: Amy Crane Today's Date: 06/03/2017 Reason for consult: Initial assessment;1st time breastfeeding  Follow up visit at 27 hours of age. Mom reports good feedings.  Lc noted baby latched to tip of nipple and slipping off.  Mom reports good breast changes during pregnancy and is having some nipple pain with feedings. LC assisted with hand expression with drops easily expressed.  LC assisted with football hold and pillow support.  Baby opens mouth wide for deep latch and has strong rhythmic sucking and some audible swallows.  Mom denies pain with this latch.  LC encouraged mom to offer EBM and latching prior to formula use.   [facility] resources given and discussed.  Encouraged to feed with early cues on demand 8-12x/day.   Early newborn behavior discussed.    Mom to call for assist as needed.     Maternal Data Has patient been taught Hand Expression?: Yes Does the patient have breastfeeding experience prior to this delivery?: No  Feeding Feeding Type: Breast Fed Length of feed: 10 min  LATCH Score Latch: Grasps breast easily, tongue down, lips flanged, rhythmical sucking.  Audible Swallowing: A few with stimulation  Type of Nipple: Everted at rest and after stimulation  Comfort (Breast/Nipple): Soft / non-tender(no pain with good latch)  Hold (Positioning): Assistance needed to correctly position infant at breast and maintain latch.  LATCH Score: 8  Interventions Interventions: Breast feeding basics reviewed;Adjust position;Assisted with latch;Support pillows;Skin to skin;Expressed milk;Hand express;Breast compression  Lactation Tools Discussed/Used WIC Program: Yes([facility]   Consult Status Consult Status: Follow-up Date: 06/04/17 Follow-up type: In-patient    Amy Crane 06/03/2017, 5:52 PM

## 2017-06-04 ENCOUNTER — Ambulatory Visit: Payer: Self-pay

## 2017-06-04 MED ORDER — IBUPROFEN 600 MG PO TABS: 600.0000 mg | ORAL_TABLET | Freq: Four times a day (QID) | ORAL | 0 refills | Status: DC

## 2017-06-04 NOTE — Lactation Note (Signed)
This note was copied from a baby's chart. Lactation Consultation Note; Assist mother with hand expression. Observed drops of colostrum. Mothers rt nipple is semi-flat. Mother taught to firm nipple before attempting latch.  Infant placed in football hold. After several attempts to latch infant sustained latch for 15 mins. Observed a swallows.   Advised mother to do good breast massage and continue to hand express before and after feedings.  Discussed treatment and prevention of engorgement . Mother advised to continue to cue base feed and feed infant at least 8-12 times in 24 hours. Mother receptive to all teaching. Mother has a harmony hand pump and advised to pre-pump as needed. Mother informed of available LC services and is active with WIC.  Patient Name: Amy Crane Today's Date: 06/04/2017     Maternal Data    Feeding    LATCH Score                   Interventions    Lactation Tools Discussed/Used     Consult Status      Amy Crane, Amy Crane 06/04/2017, 3:46 PM

## 2017-06-04 NOTE — Discharge Summary (Signed)
OB Discharge Summary     Patient Name: Amy Crane DOB: 1996-04-12 MRN: [id number]  Date of admission: 06/02/2017 Delivering MD: Pincus LargePHELPS, JAZMA Y   Date of discharge: 06/04/2017  Admitting diagnosis: 38 WEEKS CTX Intrauterine pregnancy: 5681w4d     Secondary diagnosis:  Active Problems:   Indication for care in labor or delivery   Status post normal delivery in completely normal case  Additional problems:      Discharge diagnosis: Term Pregnancy Delivered                                                                                                Post partum procedures:none  Augmentation: AROM  Complications: None  Hospital course:  Onset of Labor With Vaginal Delivery     21 y.o. yo G1P1001 at 6981w4d was admitted in Active Labor on 06/02/2017. Patient had an uncomplicated labor course as follows:  Membrane Rupture Time/Date: 10:45 AM ,06/02/2017   Intrapartum Procedures: Episiotomy: None [1]                                         Lacerations:  1st degree [2]  Patient had a delivery of a Viable infant. 06/02/2017  Information for the patient's newborn:  Amy Crane [[id number]]  Delivery Method: Vaginal, Spontaneous(Filed from Delivery Summary)    Pateint had an uncomplicated postpartum course.  She is ambulating, tolerating a regular diet, passing flatus, and urinating well. Patient is discharged home in stable condition on 06/04/17.   Physical exam  Vitals:   06/02/17 2201 06/03/17 0645 06/03/17 1755 06/04/17 0651  BP: (!) 115/56 (!) 116/54 115/69 119/60  Pulse: 82 (!) 48 63 (!) 55  Resp: 16 16 18 18   Temp: 98.4 F (36.9 C) 98.1 F (36.7 C) 98.5 F (36.9 C) 98.2 F (36.8 C)  TempSrc: Oral Oral Oral Oral  SpO2: 100%     Weight:      Height:       General: alert, cooperative and no distress Lochia: appropriate Uterine Fundus: firm  DVT Evaluation: No evidence of DVT seen on physical exam. Negative Homan's sign. No cords or calf  tenderness. No significant calf/ankle edema. Labs: Lab Results  Component Value Date   WBC 7.7 06/02/2017   HGB 10.7 (L) 06/02/2017   HCT 32.1 (L) 06/02/2017   MCV 94.1 06/02/2017   PLT 159 06/02/2017   No flowsheet data found.  Discharge instruction: per After Visit Summary and "Baby and Me Booklet".  After visit meds:  Allergies as of 06/04/2017   No Known Allergies     Medication List    TAKE these medications   ibuprofen 600 MG tablet Commonly known as:  ADVIL,MOTRIN Take 1 tablet (600 mg total) every 6 (six) hours by mouth.   PRENATAL COMPLETE 14-0.4 MG Tabs Take 1 tablet by mouth daily.       Diet: routine diet  Activity: Advance as tolerated. Pelvic rest for 6 weeks.   Outpatient follow up:4 weeks Follow up  Appt:No future appointments. Follow up Visit:No Follow-up on file.  Postpartum contraception: Undecided  Newborn Data: Live born female  Birth Weight: 6 lb 15.8 oz (3170 g) APGAR: 9, 9  Newborn Delivery   Birth date/time:  06/02/2017 13:59:00 Delivery type:  Vaginal, Spontaneous     Baby Feeding: Bottle and Breast Disposition:home with mother   06/04/2017 Amy HeritageJacob J Taivon Haroon, DO

## 2017-06-04 NOTE — Discharge Instructions (Signed)

## 2017-07-21 ENCOUNTER — Encounter: Payer: Self-pay | Admitting: Advanced Practice Midwife

## 2017-07-21 ENCOUNTER — Ambulatory Visit (INDEPENDENT_AMBULATORY_CARE_PROVIDER_SITE_OTHER): Payer: Medicaid Other | Admitting: Advanced Practice Midwife

## 2017-07-21 VITALS — BP 110/77 | HR 54 | Wt 147.0 lb

## 2017-07-21 DIAGNOSIS — Z30016 Encounter for initial prescription of transdermal patch hormonal contraceptive device: Secondary | ICD-10-CM

## 2017-07-21 LAB — POCT PREGNANCY, URINE: Preg Test, Ur: NEGATIVE

## 2017-07-21 MED ORDER — NORELGESTROMIN-ETH ESTRADIOL 150-35 MCG/24HR TD PTWK: 1.0000 | MEDICATED_PATCH | TRANSDERMAL | 12 refills | Status: DC

## 2017-07-21 NOTE — Progress Notes (Signed)
Subjective:     Amy Crane is a 21 y.o. female who presents for a postpartum visit. She is 6 weeks postpartum following a spontaneous vaginal delivery. I have fully reviewed the prenatal and intrapartum course. The delivery was at 38.4 gestational weeks. Outcome: spontaneous vaginal delivery. Anesthesia: epidural. Postpartum course has been Uncomplicated. Baby's course has been Uncomplicated. Baby is feeding by bottle - Gerber Gentle. Bleeding red. Bleeding had stopped completely since delivery and started again C/W a period. Bowel function is normal. Bladder function is normal. Patient is not sexually active. Contraception method is planning patch. Postpartum depression screening: negative.  The following portions of the patient's history were reviewed and updated as appropriate: allergies, current medications, past family history, past medical history, past social history, past surgical history and problem list.  Review of Systems Pertinent items are noted in HPI.   Objective:    BP 110/77   Pulse (!) 54   Wt 147 lb (66.7 kg)   Breastfeeding? No Comment: bottle  BMI 26.89 kg/m   General:  alert, cooperative, appears stated age and no distress   Breasts:  iDeclined  Lungs: clear to auscultation bilaterally  Heart:  regular rate and rhythm, S1, S2 normal, no murmur, click, rub or gallop  Abdomen: soft, non-tender; bowel sounds normal; no masses,  no organomegaly   Vulva:  declined  Rectal Exam: Not performed.        Assessment:     Nml postpartum exam. Pap smear not done at today's visit.   Plan:    1. Contraception: Ortho-Evra patches weekly 2. Condoms recommended for STD prevention. 3. Follow up in: 1 year or as needed.   4. Start Paps at age 21

## 2018-08-11 ENCOUNTER — Other Ambulatory Visit: Payer: Self-pay | Admitting: Advanced Practice Midwife

## 2018-08-11 DIAGNOSIS — Z30016 Encounter for initial prescription of transdermal patch hormonal contraceptive device: Secondary | ICD-10-CM

## 2018-08-11 NOTE — Telephone Encounter (Signed)
Will only refill one month. Needs to be seen by Ob/Gyn.

## 2018-11-30 ENCOUNTER — Other Ambulatory Visit: Payer: Self-pay | Admitting: Advanced Practice Midwife

## 2018-11-30 DIAGNOSIS — Z30016 Encounter for initial prescription of transdermal patch hormonal contraceptive device: Secondary | ICD-10-CM

## 2019-04-26 ENCOUNTER — Ambulatory Visit: Payer: Medicaid Other | Admitting: Student

## 2019-04-26 ENCOUNTER — Telehealth: Payer: Self-pay | Admitting: Family Medicine

## 2019-04-26 ENCOUNTER — Encounter: Payer: Self-pay | Admitting: Student

## 2019-04-26 NOTE — Telephone Encounter (Signed)
The patient called in to inform our office she is going to be late. She stated the security told she was at the wrong office and told her to go somewhere else. The patient was informed of the 15 minute grace period. She stated she was around the corner and she will be here before the period ended. She later called back and stated she would have to reschedule. While rescheduling the appointment the call dropped. The patient will be mailed an appointment reminder and a return phone call will be completed.

## 2019-05-03 ENCOUNTER — Telehealth: Payer: Self-pay | Admitting: Family Medicine

## 2019-05-03 NOTE — Telephone Encounter (Signed)
Tried to contact patient, was unable to get through, patient appointment was changed from the afternoon on 05-06-2019 to the morning due to a provider change.

## 2019-05-06 ENCOUNTER — Ambulatory Visit: Payer: Medicaid Other | Admitting: Medical

## 2019-05-06 ENCOUNTER — Encounter: Payer: Self-pay | Admitting: Family Medicine

## 2020-11-05 ENCOUNTER — Other Ambulatory Visit: Payer: Self-pay

## 2020-11-05 ENCOUNTER — Emergency Department (HOSPITAL_COMMUNITY): Payer: Medicaid Other

## 2020-11-05 ENCOUNTER — Emergency Department (HOSPITAL_COMMUNITY)
Admission: EM | Admit: 2020-11-05 | Discharge: 2020-11-05 | Disposition: A | Discharge status: Home / Self Care | Payer: Medicaid Other | Attending: Emergency Medicine | Admitting: Emergency Medicine

## 2020-11-05 ENCOUNTER — Encounter (HOSPITAL_COMMUNITY): Payer: Self-pay | Admitting: Emergency Medicine

## 2020-11-05 DIAGNOSIS — W010XXA Fall on same level from slipping, tripping and stumbling without subsequent striking against object, initial encounter: Secondary | ICD-10-CM | POA: Insufficient documentation

## 2020-11-05 DIAGNOSIS — Y9389 Activity, other specified: Secondary | ICD-10-CM | POA: Insufficient documentation

## 2020-11-05 DIAGNOSIS — S52614A Nondisplaced fracture of right ulna styloid process, initial encounter for closed fracture: Secondary | ICD-10-CM | POA: Diagnosis not present

## 2020-11-05 DIAGNOSIS — S52501A Unspecified fracture of the lower end of right radius, initial encounter for closed fracture: Secondary | ICD-10-CM | POA: Diagnosis not present

## 2020-11-05 DIAGNOSIS — S6991XA Unspecified injury of right wrist, hand and finger(s), initial encounter: Secondary | ICD-10-CM | POA: Diagnosis present

## 2020-11-05 MED ORDER — OXYCODONE-ACETAMINOPHEN 5-325 MG PO TABS: 1.0000 | ORAL_TABLET | Freq: Three times a day (TID) | ORAL | 0 refills | Status: DC | PRN

## 2020-11-05 MED ORDER — ACETAMINOPHEN 325 MG PO TABS
650.0000 mg | ORAL_TABLET | Freq: Once | ORAL | Status: AC
  Administered 2020-11-05: 650 mg via ORAL
  Filled 2020-11-05: qty 2

## 2020-11-05 MED ORDER — OXYCODONE-ACETAMINOPHEN 5-325 MG PO TABS
1.0000 | ORAL_TABLET | Freq: Once | ORAL | Status: AC
  Administered 2020-11-05: 1 via ORAL
  Filled 2020-11-05: qty 1

## 2020-11-05 NOTE — ED Notes (Signed)
Ortho tech notified of need for arm splinting.

## 2020-11-05 NOTE — ED Triage Notes (Signed)
Patient slipped and fell this evening injured her right wrist with mild swelling .

## 2020-11-05 NOTE — ED Notes (Signed)
Introduced self to pt. Pt tearful, reports falling on left wrist during easter egg hunt today. Left wrist swollen, PMS intact. Extremity elevated, ice pack provided.

## 2020-11-05 NOTE — ED Provider Notes (Signed)
[facility] EMERGENCY DEPARTMENT Provider Note   CSN: [id number] Arrival date & time: 11/05/20  1854     History Chief Complaint  Patient presents with  . Wrist Injury    Amy Crane is a 25 y.o. female who presents to ED with a chief complaint of right wrist pain.  States that she was wearing a slippery pair of shoes today and kept falling on the ground while doing an [employer].  She fell just prior to arrival with her arm extended.  She reports wrist pain since then.  She is unsure if she has had a previous injury in her childhood which required casting on this wrist.  She denies any numbness or weakness.  Denies any other injuries.  HPI     Past Medical History:  Diagnosis Date  . Medical history non-contributory     There are no problems to display for this patient.   Past Surgical History:  Procedure Laterality Date  . WISDOM TOOTH EXTRACTION       OB History    Gravida  1   Para  1   Term  1   Preterm      AB      Living  1     SAB      IAB      Ectopic      Multiple  0   Live Births  1           Family History  Problem Relation Age of Onset  . Cancer Mother        breast  . Asthma Sister   . Heart disease Maternal Grandmother   . Diabetes Maternal Grandmother     Social History   Tobacco Use  . Smoking status: Never Smoker  . Smokeless tobacco: Never Used  Substance Use Topics  . Alcohol use: No  . Drug use: Yes    Frequency: 2.0 times per week    Types: Marijuana    Comment: stopped after +HPT    Home Medications Prior to Admission medications   Medication Sig Start Date End Date Taking? Authorizing Provider  oxyCODONE-acetaminophen (PERCOCET/ROXICET) 5-325 MG tablet Take 1 tablet by mouth every 8 (eight) hours as needed for severe pain. 11/05/20  Yes Talha Iser, PA-C  ibuprofen (ADVIL,MOTRIN) 600 MG tablet Take 1 tablet (600 mg total) every 6 (six) hours by mouth. 06/04/17   Levie Heritage, DO  Prenatal Vit-Fe Fumarate-FA (PRENATAL COMPLETE) 14-0.4 MG TABS Take 1 tablet by mouth daily. 02/02/17   Marny Lowenstein, PA-C  Burr Medico 150-35 MCG/24HR transdermal patch Place 1 patch onto the skin once a week. 12/06/18   Katrinka Blazing, [place], CNM    Allergies    Patient has no known allergies.  Review of Systems   Review of Systems  Constitutional: Negative for chills and fever.  Musculoskeletal: Positive for arthralgias.  Neurological: Negative for weakness and numbness.    Physical Exam Updated Vital Signs BP 127/67 (BP Location: Left Arm)   Pulse (!) 59   Temp 98.5 F (36.9 C) (Oral)   Resp 20   Ht 5\' 2"  (1.575 m)   Wt 80 kg   SpO2 100%   BMI 32.26 kg/m   Physical Exam Vitals and nursing note reviewed.  Constitutional:      General: She is not in acute distress.    Appearance: She is well-developed. She is not diaphoretic.  HENT:     Head: Normocephalic and  atraumatic.  Eyes:     General: No scleral icterus.    Conjunctiva/sclera: Conjunctivae normal.  Pulmonary:     Effort: Pulmonary effort is normal. No respiratory distress.  Musculoskeletal:        General: Swelling and tenderness present.     Cervical back: Normal range of motion.     Comments: Tenderness to palpation of the right lateral wrist with edema noted.  Limited range of motion secondary to pain.  2+ radial pulse palpated bilaterally.  Normal sensation to light touch.  Moving all digits and elbow without difficulty.  No wounds noted.  Skin:    Findings: No rash.  Neurological:     Mental Status: She is alert.     ED Results / Procedures / Treatments   Labs (all labs ordered are listed, but only abnormal results are displayed) Labs Reviewed - No data to display  EKG None  Radiology DG Wrist Complete Right  Result Date: 11/05/2020 CLINICAL DATA:  Recent falls with wrist pain, initial encounter EXAM: RIGHT WRIST - COMPLETE 3+ VIEW COMPARISON:  None. FINDINGS: Ulnar styloid avulsion is noted.  Transverse fracture through the distal radial metaphysis is seen with impaction and posterior angulation at the fracture site. Associated soft tissue swelling is noted. No other abnormality is seen. IMPRESSION: Distal radial and ulnar fractures as described. Electronically Signed   By: Alcide Clever M.D.   On: 11/05/2020 20:52    Procedures Procedures   Medications Ordered in ED Medications  acetaminophen (TYLENOL) tablet 650 mg (650 mg Oral Given 11/05/20 2122)  oxyCODONE-acetaminophen (PERCOCET/ROXICET) 5-325 MG per tablet 1 tablet (1 tablet Oral Given 11/05/20 2122)    ED Course  I have reviewed the triage vital signs and the nursing notes.  Pertinent labs & imaging results that were available during my care of the patient were reviewed by me and considered in my medical decision making (see chart for details).    MDM Rules/Calculators/A&P                          25 year old female presenting to the ED with a chief complaint of dominant right wrist pain.  States that she fell and broke her fall with her right arm extended while doing an [employer].  Reports tenderness and pain at the right wrist on the dorsal aspect.  X-ray shows both an avulsion fracture of the ulnar styloid as well as a transverse fracture through the distal radial metaphysis with posterior angulation and impaction.  Per Ortho recommendations will place in a sugar tong splint and give pain control at home.  We will have her follow-up in orthopedic clinic outpatient.  Patient denies any other injuries.  Vital signs within normal limits.  Return precautions given.  All imaging, if done today, including plain films, CT scans, and ultrasounds, independently reviewed by me, and interpretations confirmed via formal radiology reads.  Patient is hemodynamically stable, in NAD, and able to ambulate in the ED. Evaluation does not show pathology that would require ongoing emergent intervention or inpatient treatment. I  explained the diagnosis to the patient. Pain has been managed and has no complaints prior to discharge. Patient is comfortable with above plan and is stable for discharge at this time. All questions were answered prior to disposition. Strict return precautions for returning to the ED were discussed. Encouraged follow up with PCP.   Prior to providing a prescription for a controlled substance, I independently reviewed the  patient's recent prescription history on the [place] Controlled Substance Reporting System. The patient had no recent or regular prescriptions and was deemed appropriate for a brief, less than 3 day prescription of narcotic for acute analgesia.  An After Visit Summary was printed and given to the patient.   Portions of this note were generated with Scientist, clinical (histocompatibility and immunogenetics). Dictation errors may occur despite best attempts at proofreading.  Final Clinical Impression(s) / ED Diagnoses Final diagnoses:  Closed fracture of distal end of right radius, unspecified fracture morphology, initial encounter  Closed nondisplaced fracture of styloid process of right ulna, initial encounter    Rx / DC Orders ED Discharge Orders         Ordered    oxyCODONE-acetaminophen (PERCOCET/ROXICET) 5-325 MG tablet  Every 8 hours PRN        11/05/20 2204           Dietrich Pates, PA-C 11/05/20 2216    Margarita Grizzle, MD 11/05/20 2318

## 2020-11-05 NOTE — Progress Notes (Signed)
Orthopedic Tech Progress Note Patient Details:  Amy Crane [birth year] [id number]  Ortho Devices Type of Ortho Device: Sugartong splint,Arm sling Ortho Device/Splint Location: rue Ortho Device/Splint Interventions: Ordered,Application,Adjustment   Post Interventions Patient Tolerated: Well Instructions Provided: Care of device,Adjustment of device   Trinna Post 11/05/2020, 10:55 PM

## 2020-11-05 NOTE — Discharge Instructions (Addendum)
Follow instructions regarding the fracture. Follow-up with hand specialist. Take the pain medicine as needed. Return to the ER if you start to experience worsening pain, swelling, numbness.

## 2022-08-23 IMAGING — DX DG WRIST COMPLETE 3+V*R*
4 series · 4 of 4 positions shown · non-contrast
Comparison: None.

CLINICAL DATA: Recent falls with wrist pain, initial encounter

EXAM:
RIGHT WRIST - COMPLETE 3+ VIEW

[wrist pa]
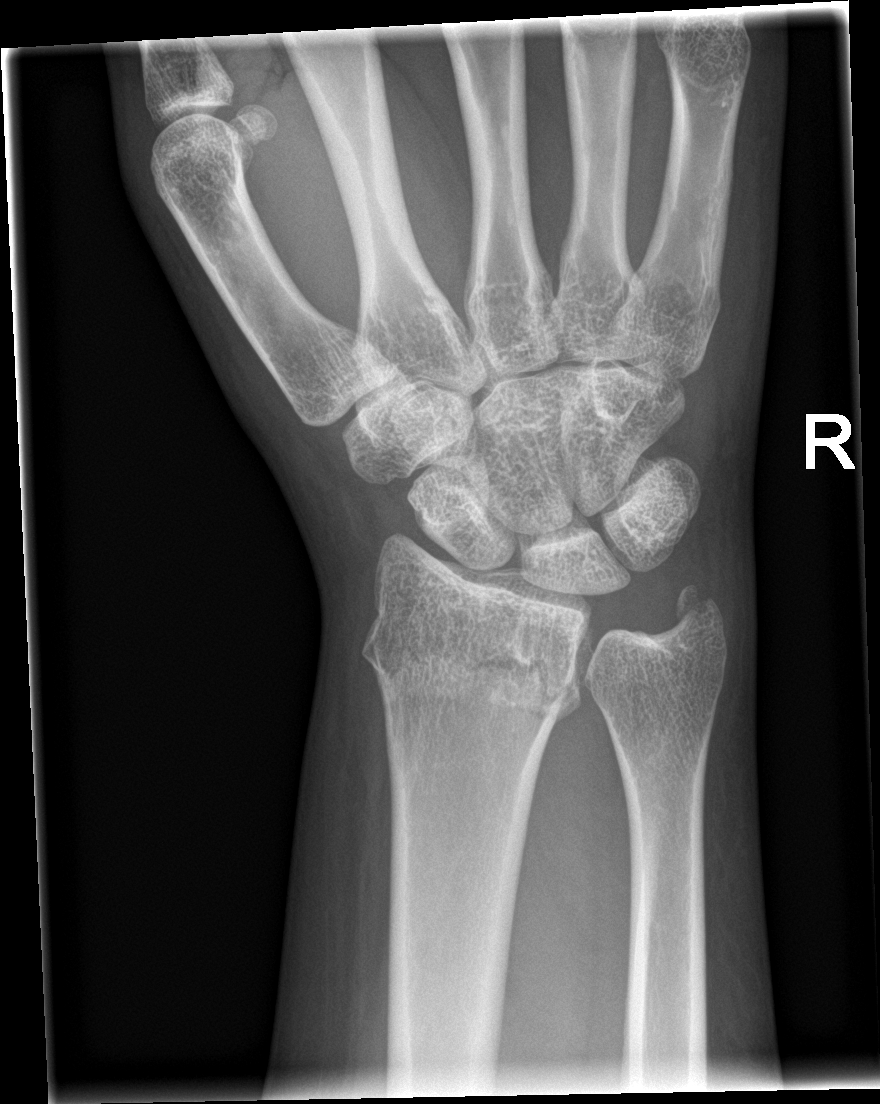

[wrist obl]
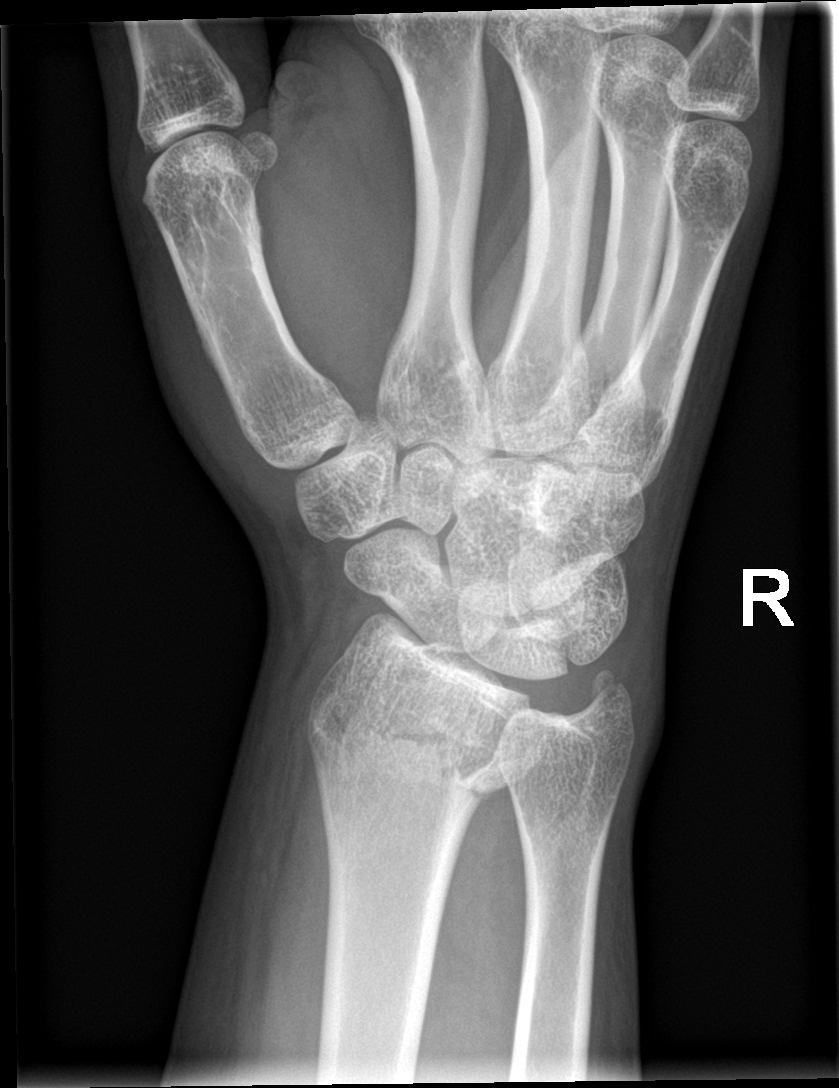

[wrist lat]
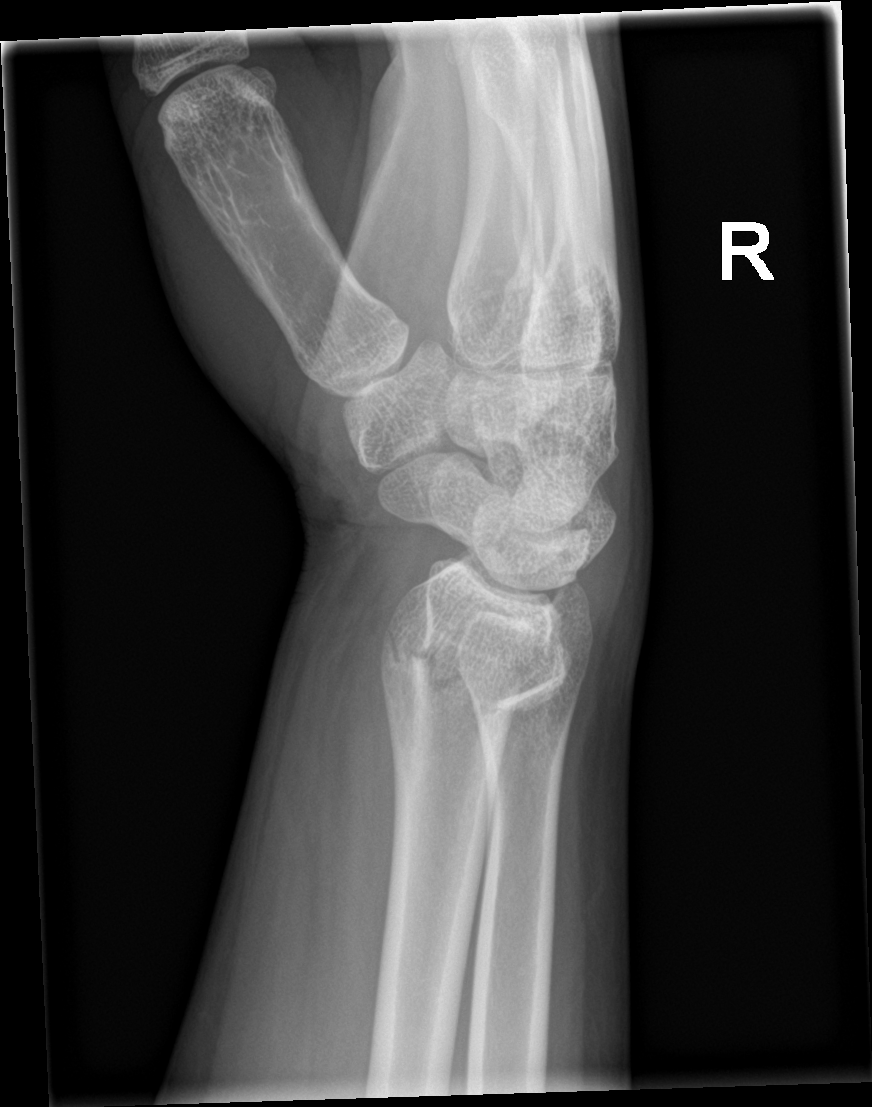

[wrist navicular]
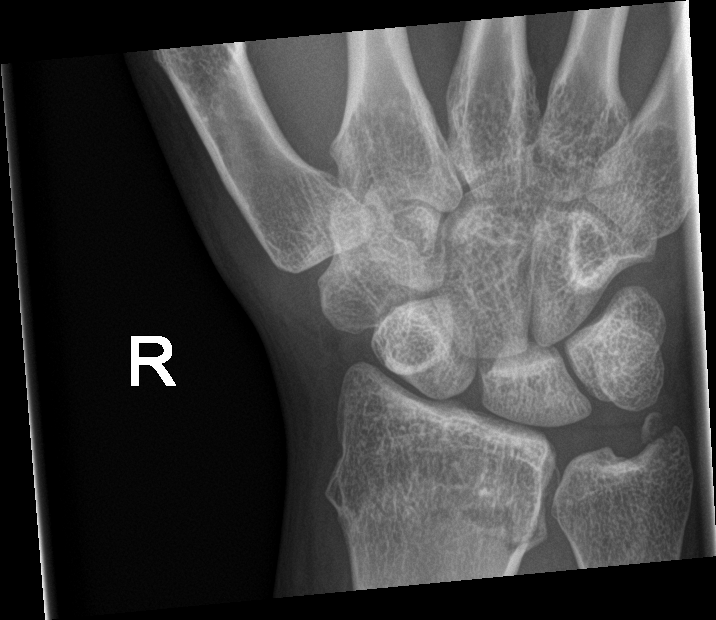

[4 of 4 positions shown; findings below may reference images not displayed]

FINDINGS: Ulnar styloid avulsion is noted. Transverse fracture through the
distal radial metaphysis is seen with impaction and posterior
angulation at the fracture site. Associated soft tissue swelling is
noted. No other abnormality is seen.
IMPRESSION: Distal radial and ulnar fractures as described.

## 2022-09-26 ENCOUNTER — Ambulatory Visit: Payer: BC Managed Care – PPO | Admitting: Family Medicine

## 2022-10-04 ENCOUNTER — Ambulatory Visit: Payer: BC Managed Care – PPO | Admitting: Family Medicine

## 2022-10-25 ENCOUNTER — Ambulatory Visit: Payer: BC Managed Care – PPO | Admitting: Family Medicine

## 2022-12-05 ENCOUNTER — Ambulatory Visit (INDEPENDENT_AMBULATORY_CARE_PROVIDER_SITE_OTHER): Payer: BC Managed Care – PPO | Admitting: Obstetrics & Gynecology

## 2022-12-05 ENCOUNTER — Encounter: Payer: Self-pay | Admitting: Obstetrics & Gynecology

## 2022-12-05 ENCOUNTER — Other Ambulatory Visit (HOSPITAL_COMMUNITY)
Admission: RE | Admit: 2022-12-05 | Discharge: 2022-12-05 | Disposition: A | Discharge status: Home / Self Care | Payer: BC Managed Care – PPO | Source: Ambulatory Visit | Attending: Obstetrics & Gynecology | Admitting: Obstetrics & Gynecology

## 2022-12-05 VITALS — BP 105/66 | HR 49 | Ht 64.0 in | Wt 187.0 lb

## 2022-12-05 DIAGNOSIS — Z01419 Encounter for gynecological examination (general) (routine) without abnormal findings: Secondary | ICD-10-CM | POA: Insufficient documentation

## 2022-12-05 DIAGNOSIS — Z113 Encounter for screening for infections with a predominantly sexual mode of transmission: Secondary | ICD-10-CM | POA: Diagnosis not present

## 2022-12-05 NOTE — Progress Notes (Signed)
WELL-WOMAN EXAMINATION Patient name: Amy Crane MRN [id number]  Date of birth: May 07, 1996 Chief Complaint:   Gynecologic Exam (1st pap smear)  History of Present Illness:   Amy Crane is a 27 y.o. G7P1001 female being seen today for a routine well-woman exam.  Today she notes no acute complaints or concerns.  Menses are regular each month.  Denies pelvic pain, vaginal discharge, itching or irritation.  They are considering another pregnancy and have started to try this month   Patient's last menstrual period was 11/14/2022.   Last pap collected today.  Last mammogram: N/A. Last colonoscopy: N/A     12/05/2022    2:15 PM 07/21/2017   11:46 AM 05/27/2017    1:35 PM 05/19/2017    2:17 PM 04/17/2017    3:30 PM  Depression screen PHQ 2/9  Decreased Interest 2 0 2 0 2  Down, Depressed, Hopeless 1 0 2 0 0  PHQ - 2 Score 3 0 4 0 2  Altered sleeping 2 0 2 0 2  Tired, decreased energy 3 0 3 0 2  Change in appetite 2 0  1 2  Feeling bad or failure about yourself  2 0 0 0 0  Trouble concentrating 1 0 0 0 0  Moving slowly or fidgety/restless 0 0 0 0 0  Suicidal thoughts 0 0 0 0 0  PHQ-9 Score 13 0 9 1 8       Review of Systems:   Pertinent items are noted in HPI Denies any headaches, blurred vision, fatigue, shortness of breath, chest pain, abdominal pain, bowel movements, urination, or intercourse unless otherwise stated above.  Pertinent History Reviewed:  Reviewed past medical,surgical, social and family history.  Reviewed problem list, medications and allergies. Physical Assessment:   Vitals:   12/05/22 1411  BP: 105/66  Pulse: (!) 49  Weight: 187 lb (84.8 kg)  Height: 5\' 4"  (1.626 m)  Body mass index is 32.1 kg/m.        Physical Examination:   General appearance - well appearing, and in no distress  Mental status - alert, oriented to person, place, and time  Psych:  She has a normal mood and affect  Skin - warm and dry, normal color, no suspicious  lesions noted  Chest - effort normal, all lung fields clear to auscultation bilaterally  Heart - normal rate and regular rhythm  Neck:  midline trachea, no thyromegaly or nodules  Breasts - breasts appear normal, no suspicious masses, no skin or nipple changes or  axillary nodes  Abdomen - soft, nontender, nondistended, no masses or organomegaly  Pelvic - VULVA: normal appearing vulva with no masses, tenderness or lesions  VAGINA: normal appearing vagina with normal color and discharge, no lesions  CERVIX: normal appearing cervix without discharge or lesions, no CMT  Thin prep pap is done with HR HPV cotesting  UTERUS: uterus is felt to be normal size, shape, consistency and nontender   ADNEXA: No adnexal masses or tenderness noted.  Extremities:  No swelling or varicosities noted  Chaperone: Faith Rogue     Assessment & Plan:  1) Well-Woman Exam -Pap collected, reviewed ASCCP guidelines -Desires STI screening  2) family planning -Patient to start prenatal vitamin daily -Encouraged healthy you -Vaccinations up-to-date  Orders Placed This Encounter  Procedures   RPR   HIV Antibody (routine testing w rflx)   Hepatitis C antibody    Meds: No orders of the defined types were placed in this encounter.  Follow-up: Return in about 1 year (around 12/05/2023) for Annual.   Myna Hidalgo, DO Attending Obstetrician & Gynecologist, [facility] for [facility], [facility]

## 2022-12-06 LAB — HIV ANTIBODY (ROUTINE TESTING W REFLEX): HIV Screen 4th Generation wRfx: NONREACTIVE

## 2022-12-06 LAB — RPR: RPR Ser Ql: NONREACTIVE

## 2022-12-06 LAB — HEPATITIS C ANTIBODY: Hep C Virus Ab: NONREACTIVE

## 2022-12-10 LAB — CYTOLOGY - PAP
Chlamydia: NEGATIVE
Comment: NEGATIVE
Comment: NEGATIVE
Comment: NORMAL
Diagnosis: NEGATIVE
High risk HPV: NEGATIVE
Neisseria Gonorrhea: NEGATIVE

## 2024-10-15 ENCOUNTER — Ambulatory Visit: Payer: Self-pay
